# Patient Record
Sex: Female | Born: 1980
Health system: Southern US, Community
[De-identification: ages and names within clinical notes are randomized; demographics above are authoritative.]

## PROBLEM LIST (undated history)

## (undated) DIAGNOSIS — F329 Major depressive disorder, single episode, unspecified: Secondary | ICD-10-CM

## (undated) DIAGNOSIS — E559 Vitamin D deficiency, unspecified: Secondary | ICD-10-CM

## (undated) DIAGNOSIS — F32A Depression, unspecified: Secondary | ICD-10-CM

## (undated) DIAGNOSIS — J45909 Unspecified asthma, uncomplicated: Secondary | ICD-10-CM

## (undated) DIAGNOSIS — T8859XA Other complications of anesthesia, initial encounter: Secondary | ICD-10-CM

## (undated) DIAGNOSIS — K219 Gastro-esophageal reflux disease without esophagitis: Secondary | ICD-10-CM

## (undated) DIAGNOSIS — T4145XA Adverse effect of unspecified anesthetic, initial encounter: Secondary | ICD-10-CM

## (undated) DIAGNOSIS — A6000 Herpesviral infection of urogenital system, unspecified: Secondary | ICD-10-CM

## (undated) DIAGNOSIS — J3089 Other allergic rhinitis: Secondary | ICD-10-CM

## (undated) DIAGNOSIS — F419 Anxiety disorder, unspecified: Secondary | ICD-10-CM

## (undated) DIAGNOSIS — R87619 Unspecified abnormal cytological findings in specimens from cervix uteri: Secondary | ICD-10-CM

## (undated) HISTORY — DX: Depression, unspecified: F32.A

## (undated) HISTORY — PX: BREAST REDUCTION SURGERY: SHX8

## (undated) HISTORY — PX: TONSILLECTOMY: SUR1361

## (undated) HISTORY — PX: OTHER SURGICAL HISTORY: SHX169

## (undated) HISTORY — DX: Major depressive disorder, single episode, unspecified: F32.9

## (undated) HISTORY — DX: Anxiety disorder, unspecified: F41.9

## (undated) HISTORY — DX: Herpesviral infection of urogenital system, unspecified: A60.00

## (undated) HISTORY — DX: Unspecified abnormal cytological findings in specimens from cervix uteri: R87.619

## (undated) HISTORY — DX: Vitamin D deficiency, unspecified: E55.9

## (undated) HISTORY — PX: MOUTH SURGERY: SHX715

## (undated) HISTORY — PX: APPENDECTOMY: SHX54

---
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
## 2009-07-25 ENCOUNTER — Ambulatory Visit: Payer: Self-pay | Admitting: Otolaryngology

## 2013-12-25 ENCOUNTER — Observation Stay: Payer: Self-pay

## 2014-01-06 ENCOUNTER — Inpatient Hospital Stay: Payer: Self-pay

## 2014-01-06 LAB — PIH PROFILE
Anion Gap: 7 (ref 7–16)
BUN: 8 mg/dL (ref 7–18)
CHLORIDE: 109 mmol/L — AB (ref 98–107)
CO2: 17 mmol/L — AB (ref 21–32)
Calcium, Total: 9.3 mg/dL (ref 8.5–10.1)
Creatinine: 0.59 mg/dL — ABNORMAL LOW (ref 0.60–1.30)
EGFR (Non-African Amer.): >60
Glucose: 77 mg/dL (ref 65–99)
HCT: 36.2 % (ref 35.0–47.0)
HGB: 11.7 g/dL — ABNORMAL LOW (ref 12.0–16.0)
MCH: 28 pg (ref 26.0–34.0)
MCHC: 32.4 g/dL (ref 32.0–36.0)
MCV: 86 fL (ref 80–100)
Osmolality: 264 (ref 275–301)
Platelet: 220 10*3/uL (ref 150–440)
Potassium: 3.7 mmol/L (ref 3.5–5.1)
RBC: 4.19 10*6/uL (ref 3.80–5.20)
RDW: 13.6 % (ref 11.5–14.5)
SGOT(AST): 17 U/L (ref 15–37)
Sodium: 133 mmol/L — ABNORMAL LOW (ref 136–145)
URIC ACID: 5.3 mg/dL (ref 2.6–6.0)
WBC: 12 10*3/uL — ABNORMAL HIGH (ref 3.6–11.0)

## 2014-01-06 LAB — PROTEIN / CREATININE RATIO, URINE
Creatinine, Urine: 53 mg/dL (ref 30.0–125.0)
PROTEIN/CREAT. RATIO: 547 mg/g{creat} — AB (ref 0–200)
Protein, Random Urine: 29 mg/dL — ABNORMAL HIGH (ref 0–12)

## 2014-01-08 LAB — CBC WITH DIFFERENTIAL/PLATELET
Basophil #: 0.2 10*3/uL — ABNORMAL HIGH (ref 0.0–0.1)
Basophil %: 2.5 %
EOS ABS: 0.3 10*3/uL (ref 0.0–0.7)
EOS PCT: 2.6 %
HCT: 35.6 % (ref 35.0–47.0)
HGB: 11.7 g/dL — AB (ref 12.0–16.0)
LYMPHS PCT: 14.6 %
Lymphocyte #: 1.4 10*3/uL (ref 1.0–3.6)
MCH: 28.5 pg (ref 26.0–34.0)
MCHC: 32.9 g/dL (ref 32.0–36.0)
MCV: 87 fL (ref 80–100)
MONOS PCT: 6.1 %
Monocyte #: 0.6 x10 3/mm (ref 0.2–0.9)
NEUTROS PCT: 74.2 %
Neutrophil #: 7.2 10*3/uL — ABNORMAL HIGH (ref 1.4–6.5)
Platelet: 199 10*3/uL (ref 150–440)
RBC: 4.12 10*6/uL (ref 3.80–5.20)
RDW: 14.1 % (ref 11.5–14.5)
WBC: 9.7 10*3/uL (ref 3.6–11.0)

## 2014-01-09 LAB — HEMATOCRIT: HCT: 30.6 % — ABNORMAL LOW (ref 35.0–47.0)

## 2014-06-14 LAB — HM PAP SMEAR: HM Pap smear: NEGATIVE

## 2014-08-29 NOTE — H&P (Signed)
L&D Evaluation:  History:  HPI 10yoMWF sent over from office for [place] work-up and IOL due to non-reassuring NST and elevated BPs   Patient's Medical History other  HSV   Patient's Surgical History other  T&A, breast reduction   Medications Pre Natal Vitamins  Tylenol (Acetaminophen)  TUMS, acyclovir   Allergies NKDA   Social History none   Family History Non-Contributory   ROS:  ROS All systems were reviewed.  HEENT, CNS, GI, GU, Respiratory, CV, Renal and Musculoskeletal systems were found to be normal.   Exam:  Vital Signs BP >140/90   Urine Protein trace   General no apparent distress   Mental Status clear   Chest clear   Heart normal sinus rhythm   Abdomen gravid, non-tender   Estimated Fetal Weight Average for gestational age   Fetal Position vtx   Back no CVAT   Edema 1+   Reflexes 1+   Clonus negative   Pelvic no external lesions, 1/th/-2   Mebranes Intact   FHT normal rate with no decels   Fetal Heart Rate 115   Ucx irregular   Length of each Contraction 50 seconds   Ucx Pain Scale 1   Impression:  Impression evaluation for PIH, IOL   Plan:  Plan EFM/NST, monitor BP, PIH panel, cervidil ripening and Pitocin IOL   Electronic Signatures: Trudee Kuster, Jennefer Kopp N (CNM)  (Signed 18-Sep-15 18:10)  Authored: L&D Evaluation   Last Updated: 18-Sep-15 18:10 by Evonnie Pat (CNM)

## 2015-01-24 ENCOUNTER — Other Ambulatory Visit (INDEPENDENT_AMBULATORY_CARE_PROVIDER_SITE_OTHER): Payer: BLUE CROSS/BLUE SHIELD | Admitting: Obstetrics and Gynecology

## 2015-01-24 DIAGNOSIS — R300 Dysuria: Secondary | ICD-10-CM

## 2015-01-24 LAB — POCT URINALYSIS DIPSTICK
Bilirubin, UA: NEGATIVE
Glucose, UA: NEGATIVE
Ketones, UA: NEGATIVE
LEUKOCYTES UA: NEGATIVE
Nitrite, UA: NEGATIVE
PROTEIN UA: NEGATIVE
SPEC GRAV UA: 1.01
Urobilinogen, UA: 0.2
pH, UA: 7

## 2015-01-24 NOTE — Progress Notes (Unsigned)
Pt dropped off UA, c/o RLQ pain, some pressure when urinating

## 2015-01-25 LAB — URINE CULTURE: Organism ID, Bacteria: NO GROWTH

## 2015-01-26 ENCOUNTER — Encounter: Payer: Self-pay | Admitting: Obstetrics and Gynecology

## 2015-01-26 ENCOUNTER — Ambulatory Visit: Payer: BLUE CROSS/BLUE SHIELD

## 2015-01-26 ENCOUNTER — Ambulatory Visit (INDEPENDENT_AMBULATORY_CARE_PROVIDER_SITE_OTHER): Payer: BLUE CROSS/BLUE SHIELD | Admitting: Obstetrics and Gynecology

## 2015-01-26 VITALS — BP 110/81 | HR 82 | Ht 68.0 in | Wt 169.6 lb

## 2015-01-26 DIAGNOSIS — R102 Pelvic and perineal pain: Secondary | ICD-10-CM | POA: Diagnosis not present

## 2015-01-26 NOTE — Progress Notes (Signed)
Subjective:     Patient ID: Angela [name], female   DOB: 06-28-1980, 34 y.o.   MRN: [id number]  HPI Pelvic pain- sudden onset 3 nights ago- woke her up and persisted on/off through the night, and has reoccured intermittent since, not relieved with ibuprofen, worse when ambulation, better if she lays supine; is two weeks from LMP that was normal. UA and Urine culture this week was negative. Can't palpate IUD string, and had bright red spotting x 2 days.Denies any urinary or bowel changes.  Review of Systems See above    Objective:   Physical Exam A&O x4 Well groomed female in mild pain Abdomen soft and slightly tender in RLQ Pelvic exam: CERVIX: multiparous os, IUD string noted and scant red discharge noted, UTERUS: tenderness with mov't and shifted over to the left, retroverted, mobile, not enlarged, ADNEXA: tenderness right, mass present right side, size 2-4 cm, pushing uterus to the left  Previous UA and urine culture negative  Pelvis ultrasound reveals: Findings:  The uterus measures 6.8 x 4.4 x 5.4 cm and is retroverted. Echo texture is homogenous without evidence of focal masses. There is an IUD seen within the uterus and it appears to be in the correct position.   The Endometrium measures 2.5 mm.  Right Ovary measures 4.0 x 2.2 x 2.0 cm. There are multiple follicles seen as well as good vascularity.  Left Ovary measures 31.8.8 x 2.4 x  Cm. Multiple follicles seen with good vascular flow to the ovary.  Survey of the adnexa demonstrates no adnexal masses. There is no free fluid in the cul de sac.  Impression: 1. Normal appearing pelvic ultrasound with retroverted uterus with IUD seen in proper position.  .    Assessment:     Pelvic pain IUD check BTB with mirena     Plan:     Reviewed findings with patients. Recommend watching and pain worsens to go in to ED for evaluation of appendix, otherwise to let me know next week how she is feeling.      Melody Trudee Kuster, CNM

## 2015-01-30 ENCOUNTER — Telehealth: Payer: Self-pay | Admitting: Obstetrics and Gynecology

## 2015-01-30 NOTE — Telephone Encounter (Signed)
PT CALLED IN WAS TOLD TO CALL IN TODAY TO GIVE YOU AN UPDATE, SHE IS BETTER BUT NOT 100 %, SHE STATED TAHT HER PCP MIGHT HAVE LYMPHADENITIS, SHE IS STIL HAVE A 5-6 OUT OF 10 PAIN.

## 2015-06-18 ENCOUNTER — Encounter: Payer: Self-pay | Admitting: *Deleted

## 2015-06-21 ENCOUNTER — Encounter: Payer: Self-pay | Admitting: Obstetrics and Gynecology

## 2015-08-14 ENCOUNTER — Encounter: Payer: Self-pay | Admitting: Obstetrics and Gynecology

## 2015-08-14 ENCOUNTER — Ambulatory Visit (INDEPENDENT_AMBULATORY_CARE_PROVIDER_SITE_OTHER): Payer: BLUE CROSS/BLUE SHIELD | Admitting: Obstetrics and Gynecology

## 2015-08-14 VITALS — BP 103/74 | HR 94 | Wt 170.1 lb

## 2015-08-14 DIAGNOSIS — Z01419 Encounter for gynecological examination (general) (routine) without abnormal findings: Secondary | ICD-10-CM | POA: Diagnosis not present

## 2015-08-14 DIAGNOSIS — J302 Other seasonal allergic rhinitis: Secondary | ICD-10-CM | POA: Diagnosis not present

## 2015-08-14 MED ORDER — ALBUTEROL SULFATE (2.5 MG/3ML) 0.083% IN NEBU: 2.5000 mg | INHALATION_SOLUTION | Freq: Four times a day (QID) | RESPIRATORY_TRACT | Status: DC | PRN

## 2015-08-14 MED ORDER — IBUPROFEN 800 MG PO TABS: 800.0000 mg | ORAL_TABLET | Freq: Three times a day (TID) | ORAL | Status: DC | PRN

## 2015-08-14 MED ORDER — ALBUTEROL SULFATE HFA 108 (90 BASE) MCG/ACT IN AERS: 2.0000 | INHALATION_SPRAY | Freq: Four times a day (QID) | RESPIRATORY_TRACT | Status: DC | PRN

## 2015-08-14 NOTE — Patient Instructions (Signed)
  Place annual gynecologic exam patient instructions here.  Thank you for enrolling in [facility]. Please follow the instructions below to securely access your online medical record. MyChart allows you to send messages to your doctor, view your test results, manage appointments, and more.   How Do I Sign Up? 1. In your Internet browser, go to [employer] and enter [email or web address]. 2. Click on the Sign Up Now link in the Sign In box. You will see the New Member Sign Up page. 3. Enter your MyChart Access Code exactly as it appears below. You will not need to use this code after you've completed the sign-up process. If you do not sign up before the expiration date, you must request a new code.  MyChart Access Code: [email or web address] Expires: 10/13/2015  2:29 PM  4. Enter your Social Security Number ([id number]) and Date of Birth (mm/dd/yyyy) as indicated and click Submit. You will be taken to the next sign-up page. 5. Create a MyChart ID. This will be your MyChart login ID and cannot be changed, so think of one that is secure and easy to remember. 6. Create a MyChart password. You can change your password at any time. 7. Enter your Password Reset Question and Answer. This can be used at a later time if you forget your password.  8. Enter your e-mail address. You will receive e-mail notification when new information is available in [place]. 9. Click Sign Up. You can now view your medical record.   Additional Information Remember, MyChart is NOT to be used for urgent needs. For medical emergencies, dial 911.

## 2015-08-14 NOTE — Progress Notes (Signed)
Subjective:   Angela [name] is a 35 y.o. G73P0 Caucasian female here for a routine well-woman exam.  Patient's last menstrual period was 08/01/2015.    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  Past Medical History Past Medical History  Diagnosis Date  . Herpes genitalis   . Abnormal Pap smear of cervix   . Vitamin D deficiency   . Anxiety   . Depression     Past Surgical History Past Surgical History  Procedure Laterality Date  . Appendectomy    . Breast reduction surgery      Gynecologic History G1P0  Patient's last menstrual period was 08/01/2015. Contraception: IUD Last Pap: 2016. Results were: normal Last mammogram: 2010. Results were: normal  Obstetric History OB History  Gravida Para Term Preterm AB SAB TAB Ectopic Multiple Living  1         1    # Outcome Date GA Lbr Len/2nd Weight Sex Delivery Anes PTL Lv  1 Gravida 01/08/14    M Vag-Spont   Y      Current Medications Current Outpatient Prescriptions on File Prior to Visit  Medication Sig Dispense Refill  . levonorgestrel (MIRENA) 20 MCG/24HR IUD 1 each by Intrauterine route once.     No current facility-administered medications on file prior to visit.    Review of Systems Patient denies any headaches, blurred vision, shortness of breath, chest pain, abdominal pain, problems with bowel movements, urination, or intercourse.  Objective:  BP 103/74 mmHg  Pulse 94  Wt 170 lb 1.6 oz (77.157 kg)  LMP 08/01/2015  Breastfeeding? No Physical Exam   General:  Well developed, well nourished, no acute distress. She is alert and oriented x3. Skin:  Warm and dry Neck:  Midline trachea, no thyromegaly or nodules Cardiovascular: Regular rate and rhythm, no murmur heard Lungs:  Effort normal, all lung fields clear to auscultation bilaterally Breasts:  No dominant palpable mass, retraction, or nipple discharge Abdomen:  Soft, non tender, no hepatosplenomegaly or masses Pelvic:  External genitalia is normal in appearance.  The vagina is normal in appearance. The cervix is bulbous, no CMT.  IUD string palpated in cervical os. Thin prep pap is not done . Uterus is felt to be normal size, shape, and contour.  No adnexal masses or tenderness noted. Extremities:  No swelling or varicosities noted Psych:  She has a normal mood and affect  Assessment:   Healthy well-woman exam Seasonal asthma Left knee pain-from previous injury  Plan:  meds refilled Discussed potential future pregnancy F/U 1 year for AE, or sooner if needed   Melody Rockney Ghee, CNM

## 2016-02-05 ENCOUNTER — Telehealth: Payer: Self-pay | Admitting: Obstetrics and Gynecology

## 2016-02-05 ENCOUNTER — Other Ambulatory Visit: Payer: Self-pay | Admitting: *Deleted

## 2016-02-05 DIAGNOSIS — N926 Irregular menstruation, unspecified: Secondary | ICD-10-CM

## 2016-02-05 NOTE — Telephone Encounter (Signed)
Periods lasting up to 2wks/ bleeding after sex/ increased dizziness with PMS / call b/t 130 -[phone].Marland Kitchen And after 330 pm

## 2016-02-05 NOTE — Telephone Encounter (Signed)
Please ask her if the bleeding is heavy or spotting? And if heavy may need to come in for placement u/s (she has a Mirena), also any more symptoms with dizziness? How long does it last?

## 2016-02-05 NOTE — Telephone Encounter (Signed)
Called pt she is coming in 02/07/16 for [place]

## 2016-02-05 NOTE — Telephone Encounter (Signed)
pls advise

## 2016-02-07 ENCOUNTER — Ambulatory Visit (INDEPENDENT_AMBULATORY_CARE_PROVIDER_SITE_OTHER): Payer: BLUE CROSS/BLUE SHIELD

## 2016-02-07 DIAGNOSIS — N926 Irregular menstruation, unspecified: Secondary | ICD-10-CM | POA: Diagnosis not present

## 2016-03-06 ENCOUNTER — Encounter: Payer: Self-pay | Admitting: Obstetrics and Gynecology

## 2016-03-06 ENCOUNTER — Ambulatory Visit (INDEPENDENT_AMBULATORY_CARE_PROVIDER_SITE_OTHER): Payer: BLUE CROSS/BLUE SHIELD | Admitting: Obstetrics and Gynecology

## 2016-03-06 VITALS — BP 111/84 | HR 96 | Ht 68.0 in | Wt 166.8 lb

## 2016-03-06 DIAGNOSIS — Z30432 Encounter for removal of intrauterine contraceptive device: Secondary | ICD-10-CM | POA: Diagnosis not present

## 2016-03-06 NOTE — Progress Notes (Signed)
Angela [name] is a 35 y.o. year old G9P0 Caucasian female who presents for removal of a Mirena IUD. Her Mirena IUD was placed 2 years ago, but continues to have prolonged menses. Desires switching to Nuvaring..   Patient's last menstrual period was 02/24/2016. BP 111/84   Pulse 96   Ht 5\' 8"  (1.727 m)   Wt 166 lb 12.8 oz (75.7 kg)   LMP 02/24/2016   BMI 25.36 kg/m   Time out was performed.  A graves speculum was placed in the vagina.  The cervix was visualized, and the strings were visible. They were grasped and the Mirena was easily removed intact without complications.   F/U as needed.  Melody Rockney Ghee, CNM

## 2016-04-25 ENCOUNTER — Telehealth: Payer: Self-pay | Admitting: Obstetrics and Gynecology

## 2016-04-25 NOTE — Telephone Encounter (Signed)
PT CALLED AND NEEDS TO SEE IF WE CAN RE SEND THE RX FOR HER NUVA RING IN TO [facility]. SHE HAS BEEN TRYING TO GET SOMEONE ON THE PHONE TO ASK IF THEY RECEIVED IT BUT SHE CANT GET ANYONE OF THE PHONE. SO SHE WANTED TO SEE IF WE CAN RE SEND IT, SHE NEEDS THE NEW ONE BY 1/18

## 2016-04-28 ENCOUNTER — Other Ambulatory Visit: Payer: Self-pay | Admitting: *Deleted

## 2016-04-28 MED ORDER — ETONOGESTREL-ETHINYL ESTRADIOL 0.12-0.015 MG/24HR VA RING: VAGINAL_RING | VAGINAL | 6 refills | Status: DC

## 2016-04-28 NOTE — Telephone Encounter (Signed)
Done-ac 

## 2016-08-14 ENCOUNTER — Ambulatory Visit (INDEPENDENT_AMBULATORY_CARE_PROVIDER_SITE_OTHER): Payer: BLUE CROSS/BLUE SHIELD | Admitting: Obstetrics and Gynecology

## 2016-08-14 ENCOUNTER — Encounter: Payer: Self-pay | Admitting: Obstetrics and Gynecology

## 2016-08-14 VITALS — BP 109/76 | HR 78 | Ht 68.0 in | Wt 166.9 lb

## 2016-08-14 DIAGNOSIS — Z975 Presence of (intrauterine) contraceptive device: Secondary | ICD-10-CM | POA: Diagnosis not present

## 2016-08-14 DIAGNOSIS — Z01419 Encounter for gynecological examination (general) (routine) without abnormal findings: Secondary | ICD-10-CM | POA: Diagnosis not present

## 2016-08-14 DIAGNOSIS — N921 Excessive and frequent menstruation with irregular cycle: Secondary | ICD-10-CM

## 2016-08-14 MED ORDER — ETONOGESTREL-ETHINYL ESTRADIOL 0.12-0.015 MG/24HR VA RING: VAGINAL_RING | VAGINAL | 4 refills | Status: DC

## 2016-08-14 NOTE — Progress Notes (Signed)
Subjective:   Angela [name] is a 36 y.o. G44P0 Caucasian female here for a routine well-woman exam.  No LMP recorded. Patient is not currently having periods (Reason: Other).    Current complaints: BTB with nuvaring, starting a week before it should PCP: Clemmie Krill       doesn't desire labs  Social History: Sexual: heterosexual Marital Status: married Living situation: with family Occupation: Art therapist Tobacco/alcohol: no tobacco use Illicit drugs: no history of illicit drug use  The following portions of the patient's history were reviewed and updated as appropriate: allergies, current medications, past family history, past medical history, past social history, past surgical history and problem list.  Past Medical History Past Medical History:  Diagnosis Date  . Abnormal Pap smear of cervix   . Anxiety   . Depression   . Herpes genitalis   . Vitamin D deficiency     Past Surgical History Past Surgical History:  Procedure Laterality Date  . APPENDECTOMY    . BREAST REDUCTION SURGERY      Gynecologic History G1P0  No LMP recorded. Patient is not currently having periods (Reason: Other). Contraception: NuvaRing vaginal inserts Last Pap: 2016. Results were: normal   Obstetric History OB History  Gravida Para Term Preterm AB Living  1         1  SAB TAB Ectopic Multiple Live Births          1    # Outcome Date GA Lbr Len/2nd Weight Sex Delivery Anes PTL Lv  1 Gravida 01/08/14    M Vag-Spont   LIV      Current Medications Current Outpatient Prescriptions on File Prior to Visit  Medication Sig Dispense Refill  . albuterol (PROVENTIL HFA;VENTOLIN HFA) 108 (90 Base) MCG/ACT inhaler Inhale 2 puffs into the lungs every 6 (six) hours as needed for wheezing or shortness of breath. 1 Inhaler 2  . albuterol (PROVENTIL) (2.5 MG/3ML) 0.083% nebulizer solution Take 3 mLs (2.5 mg total) by nebulization every 6 (six) hours as needed for wheezing or shortness of breath. 75 mL  12  . azelastine (ASTELIN) 0.1 % nasal spray Place into both nostrils 2 (two) times daily. Use in each nostril as directed    . cetirizine (ZYRTEC) 10 MG tablet Take 10 mg by mouth daily.    Marland Kitchen etonogestrel-ethinyl estradiol (NUVARING) 0.12-0.015 MG/24HR vaginal ring Insert vaginally and leave in place for 3 consecutive weeks, then remove for 1 week. 3 each 6  . fluticasone (FLONASE) 50 MCG/ACT nasal spray Place into both nostrils daily.    Marland Kitchen ibuprofen (ADVIL,MOTRIN) 800 MG tablet Take 1 tablet (800 mg total) by mouth every 8 (eight) hours as needed. 50 tablet 1   No current facility-administered medications on file prior to visit.     Review of Systems Patient denies any headaches, blurred vision, shortness of breath, chest pain, abdominal pain, problems with bowel movements, urination, or intercourse.  Objective:  BP 109/76   Pulse 78   Ht 5\' 8"  (1.727 m)   Wt 166 lb 14.4 oz (75.7 kg)   BMI 25.38 kg/m  Physical Exam  General:  Well developed, well nourished, no acute distress. She is alert and oriented x3. Skin:  Warm and dry Neck:  Midline trachea, no thyromegaly or nodules Cardiovascular: Regular rate and rhythm, no murmur heard Lungs:  Effort normal, all lung fields clear to auscultation bilaterally Breasts:  No dominant palpable mass, retraction, or nipple discharge, scars from reduction noted without defect(not new) Abdomen:  Soft, non tender, no hepatosplenomegaly or masses Pelvic:  External genitalia is normal in appearance.  The vagina is normal in appearance. The cervix is bulbous, no CMT.  Thin prep pap is not done. Uterus is felt to be normal size, shape, and contour.  No adnexal masses or tenderness noted. Extremities:  No swelling or varicosities noted Psych:  She has a normal mood and affect  Assessment:   Healthy well-woman exam BTB on Nuvaring  Plan:  desirs to continue with Nuvaring, gave taytulla sample to take on bleeding days prn F/U 1 year for AE, or  sooner if needed   Savahanna Almendariz Rockney Ghee, CNM

## 2016-11-23 ENCOUNTER — Ambulatory Visit
Admission: EM | Admit: 2016-11-23 | Discharge: 2016-11-23 | Disposition: A | Discharge status: Home / Self Care | Payer: 59 | Attending: Family Medicine | Admitting: Family Medicine

## 2016-11-23 ENCOUNTER — Encounter: Payer: Self-pay | Admitting: Gynecology

## 2016-11-23 DIAGNOSIS — R05 Cough: Secondary | ICD-10-CM | POA: Diagnosis not present

## 2016-11-23 DIAGNOSIS — R059 Cough, unspecified: Secondary | ICD-10-CM

## 2016-11-23 DIAGNOSIS — J069 Acute upper respiratory infection, unspecified: Secondary | ICD-10-CM

## 2016-11-23 MED ORDER — DEXAMETHASONE SODIUM PHOSPHATE 10 MG/ML IJ SOLN
10.0000 mg | Freq: Once | INTRAMUSCULAR | Status: AC
  Administered 2016-11-23: 10 mg via INTRAMUSCULAR

## 2016-11-23 MED ORDER — BENZONATATE 100 MG PO CAPS: 100.0000 mg | ORAL_CAPSULE | Freq: Three times a day (TID) | ORAL | 0 refills | Status: DC | PRN

## 2016-11-23 MED ORDER — ALBUTEROL SULFATE HFA 108 (90 BASE) MCG/ACT IN AERS: 2.0000 | INHALATION_SPRAY | RESPIRATORY_TRACT | 0 refills | Status: AC | PRN

## 2016-11-23 NOTE — Discharge Instructions (Signed)
Take medication as prescribed. Rest. Drink plenty of fluids.  ° °Follow up with your primary care physician this week as needed. Return to Urgent care for new or worsening concerns.  ° °

## 2016-11-23 NOTE — ED Triage Notes (Signed)
Patient c/o URI x couple days.

## 2016-11-23 NOTE — ED Provider Notes (Signed)
[facility] ____________________________________________  Time seen: Approximately 9:33 AM  I have reviewed the triage vital signs and the nursing notes.   HISTORY  Chief Complaint URI   HPI Angela [name] is a 36 y.o. female  presenting for evaluation of cough and chest congestion with accompanying wheezing. Patient reports she has had a cough and chest congestion that has been present for approximately 4 days. Patient reports minimal nasal congestion. Denies sore throat. Patient reports that she has a history of seasonal allergies, environmental allergies and asthma. Patient expressed concern that current symptoms being triggered by possible mold due to amount of rain that has been having recently. Reports the last 2 days that she has had increased wheezing consistent with her asthma, but reports wheezing has worsened. Reports she has been using home albuterol nebulizer with some improvement. Used albuterol inhaler just prior to exam. Denies home sick Contacts. Reports Recently around [facility]. Denies fevers, sore throat, sinus pressure, weakness, chest pain. Reports some shortness of breath directly associated with wheezing and cough, denies any other shortness of breath, and states no shortness of breath except for with wheezing and cough. reports did take some over-the-counter Mucinex which helped some, no resolution. Denies taking other over-the-counter medications. Reports has continued to remain active. Reports continues to eat and drink well. Denies other complaints. Patient states typically at this point she requires steroids to assist in her asthma. Patient states that her primary care treat her with injectable steroids as oral steroids interferes with her acid reflux. Denies chest pain, hemoptysis, chest pain with deep breath, recent immobilization, abdominal pain, dysuria, extremity pain, extremity swelling or rash. Denies recent sickness. Denies recent antibiotic  use.   Lynnell Jude, MD: PCP Patient's last menstrual period was 11/09/2016.Denies pregnancy.    Past Medical History:  Diagnosis Date  . Abnormal Pap smear of cervix   . Anxiety   . Depression   . Herpes genitalis   . Vitamin D deficiency     There are no active problems to display for this patient.   Past Surgical History:  Procedure Laterality Date  . APPENDECTOMY    . BREAST REDUCTION SURGERY       No current facility-administered medications for this encounter.   Current Outpatient Prescriptions:  .  albuterol (PROVENTIL HFA;VENTOLIN HFA) 108 (90 Base) MCG/ACT inhaler, Inhale 2 puffs into the lungs every 6 (six) hours as needed for wheezing or shortness of breath., Disp: 1 Inhaler, Rfl: 2 .  albuterol (PROVENTIL) (2.5 MG/3ML) 0.083% nebulizer solution, Take 3 mLs (2.5 mg total) by nebulization every 6 (six) hours as needed for wheezing or shortness of breath., Disp: 75 mL, Rfl: 12 .  azelastine (ASTELIN) 0.1 % nasal spray, Place into both nostrils 2 (two) times daily. Use in each nostril as directed, Disp: , Rfl:  .  cetirizine (ZYRTEC) 10 MG tablet, Take 10 mg by mouth daily., Disp: , Rfl:  .  etonogestrel-ethinyl estradiol (NUVARING) 0.12-0.015 MG/24HR vaginal ring, Insert vaginally and leave in place for 3 consecutive weeks, then remove for 1 week., Disp: 3 each, Rfl: 4 .  fluticasone (FLONASE) 50 MCG/ACT nasal spray, Place into both nostrils daily., Disp: , Rfl:  .  ibuprofen (ADVIL,MOTRIN) 800 MG tablet, Take 1 tablet (800 mg total) by mouth every 8 (eight) hours as needed., Disp: 50 tablet, Rfl: 1 .  albuterol (PROVENTIL HFA;VENTOLIN HFA) 108 (90 Base) MCG/ACT inhaler, Inhale 2 puffs into the lungs every 4 (four) hours as needed for wheezing  or shortness of breath., Disp: 1 Inhaler, Rfl: 0 .  benzonatate (TESSALON PERLES) 100 MG capsule, Take 1 capsule (100 mg total) by mouth 3 (three) times daily as needed for cough., Disp: 15 capsule, Rfl: 0  Allergies Patient  has no known allergies.  No family history on file.  Social History Social History  Substance Use Topics  . Smoking status: Never Smoker  . Smokeless tobacco: Never Used  . Alcohol use Yes     Comment: occas    Review of Systems Constitutional: No fever/chills Eyes: No visual changes. ENT: No sore throat. Cardiovascular: Denies chest pain. Respiratory: AS above.  Gastrointestinal: No abdominal pain.  No nausea, no vomiting.  No diarrhea.  No constipation. Genitourinary: Negative for dysuria. Musculoskeletal: Negative for back pain. Skin: Negative for rash.  ____________________________________________   PHYSICAL EXAM:  VITAL SIGNS: ED Triage Vitals  Enc Vitals Group     BP 11/23/16 0907 115/77     Pulse Rate 11/23/16 0907 90     Resp 11/23/16 0907 16     Temp 11/23/16 0907 98.6 F (37 C)     Temp Source 11/23/16 0907 Oral     SpO2 11/23/16 0907 100 %     Weight 11/23/16 0906 170 lb (77.1 kg)     Height 11/23/16 0906 5\' 8"  (1.727 m)     Head Circumference --      Peak Flow --      Pain Score 11/23/16 0907 0     Pain Loc --      Pain Edu? --      Excl. in [place]? --     Constitutional: Alert and oriented. Well appearing and in no acute distress. Head: Atraumatic. No sinus tenderness to palpation. No swelling. No erythema.  Ears: no erythema, normal TMs bilaterally.   Nose:No nasal congestion  Mouth/Throat: Mucous membranes are moist. No pharyngeal erythema. No tonsillar swelling or exudate.  Neck: No stridor.  No cervical spine tenderness to palpation. Hematological/Lymphatic/Immunilogical: No cervical lymphadenopathy. Cardiovascular: Normal rate, regular rhythm. Grossly normal heart sounds.  Good peripheral circulation. Respiratory: Normal respiratory effort.  No retractions. No wheezes, rales or rhonchi. Good air movement.  Dry intermittent cough in room with bronchospasm. Speaking in complete sentences. Gastrointestinal: Soft and nontender.  Musculoskeletal:  Ambulatory with steady gait. No cervical, thoracic or lumbar tenderness to palpation. Neurologic:  Normal speech and language. No gait instability. Skin:  Skin appears warm, dry and intact. No rash noted. Psychiatric: Mood and affect are normal. Speech and behavior are normal.  ___________________________________________   LABS (all labs ordered are listed, but only abnormal results are displayed)  Labs Reviewed - No data to display   PROCEDURES Procedures   INITIAL IMPRESSION / ASSESSMENT AND PLAN / ED COURSE  Pertinent labs & imaging results that were available during my care of the patient were reviewed by me and considered in my medical decision making (see chart for details).  Very well-appearing patient. No acute distress. Suspect environmental allergy versus viral upper respiratory infection with asthma exacerbation. Discussed in detail with patient, no clear indication for an antibiotic use at this time. Encouraged supportive care. Patient states she does not tolerate oral prednisone well. 10 mg IM Decadron given once in urgent care. Continue use of home albuterol as needed, prn tessalon perles. Discussed indication, risks and benefits of medications with patient.  Discussed follow up with Primary care physician this week. Discussed follow up and return parameters including no resolution or any worsening concerns.  Patient verbalized understanding and agreed to plan.   ____________________________________________   FINAL CLINICAL IMPRESSION(S) / ED DIAGNOSES  Final diagnoses:  Cough  Upper respiratory tract infection, unspecified type     Discharge Medication List as of 11/23/2016  9:47 AM    START taking these medications   Details  benzonatate (TESSALON PERLES) 100 MG capsule Take 1 capsule (100 mg total) by mouth 3 (three) times daily as needed for cough., Starting Sun 11/23/2016, Normal        Note: This dictation was prepared with Dragon dictation along with  smaller phrase technology. Any transcriptional errors that result from this process are unintentional.         Marylene Land, NP 11/23/16 1730

## 2016-12-18 ENCOUNTER — Encounter: Payer: Self-pay | Admitting: Obstetrics and Gynecology

## 2016-12-18 ENCOUNTER — Ambulatory Visit (INDEPENDENT_AMBULATORY_CARE_PROVIDER_SITE_OTHER): Payer: 59 | Admitting: Obstetrics and Gynecology

## 2016-12-18 VITALS — BP 118/81 | HR 86 | Ht 68.0 in | Wt 176.0 lb

## 2016-12-18 DIAGNOSIS — O09529 Supervision of elderly multigravida, unspecified trimester: Secondary | ICD-10-CM

## 2016-12-18 DIAGNOSIS — R12 Heartburn: Secondary | ICD-10-CM | POA: Diagnosis not present

## 2016-12-18 DIAGNOSIS — N926 Irregular menstruation, unspecified: Secondary | ICD-10-CM

## 2016-12-18 LAB — POCT URINE PREGNANCY: PREG TEST UR: POSITIVE — AB

## 2016-12-18 MED ORDER — RANITIDINE HCL 75 MG PO TABS: 75.0000 mg | ORAL_TABLET | Freq: Two times a day (BID) | ORAL | 10 refills | Status: DC

## 2016-12-18 NOTE — Patient Instructions (Signed)
First Trimester of Pregnancy The first trimester of pregnancy is from week 1 until the end of week 13 (months 1 through 3). A week after a sperm fertilizes an egg, the egg will implant on the wall of the uterus. This embryo will begin to develop into a baby. Genes from you and your partner will form the baby. The female genes will determine whether the baby will be a boy or a girl. At 6-8 weeks, the eyes and face will be formed, and the heartbeat can be seen on ultrasound. At the end of 12 weeks, all the baby's organs will be formed. Now that you are pregnant, you will want to do everything you can to have a healthy baby. Two of the most important things are to get good prenatal care and to follow your health care provider's instructions. Prenatal care is all the medical care you receive before the baby's birth. This care will help prevent, find, and treat any problems during the pregnancy and childbirth. Body changes during your first trimester Your body goes through many changes during pregnancy. The changes vary from woman to woman.  You may gain or lose a couple of pounds at first.  You may feel sick to your stomach (nauseous) and you may throw up (vomit). If the vomiting is uncontrollable, call your health care provider.  You may tire easily.  You may develop headaches that can be relieved by medicines. All medicines should be approved by your health care provider.  You may urinate more often. Painful urination may mean you have a bladder infection.  You may develop heartburn as a result of your pregnancy.  You may develop constipation because certain hormones are causing the muscles that push stool through your intestines to slow down.  You may develop hemorrhoids or swollen veins (varicose veins).  Your breasts may begin to grow larger and become tender. Your nipples may stick out more, and the tissue that surrounds them (areola) may become darker.  Your gums may bleed and may be  sensitive to brushing and flossing.  Dark spots or blotches (chloasma, mask of pregnancy) may develop on your face. This will likely fade after the baby is born.  Your menstrual periods will stop.  You may have a loss of appetite.  You may develop cravings for certain kinds of food.  You may have changes in your emotions from day to day, such as being excited to be pregnant or being concerned that something may go wrong with the pregnancy and baby.  You may have more vivid and strange dreams.  You may have changes in your hair. These can include thickening of your hair, rapid growth, and changes in texture. Some women also have hair loss during or after pregnancy, or hair that feels dry or thin. Your hair will most likely return to normal after your baby is born.  What to expect at prenatal visits During a routine prenatal visit:  You will be weighed to make sure you and the baby are growing normally.  Your blood pressure will be taken.  Your abdomen will be measured to track your baby's growth.  The fetal heartbeat will be listened to between weeks 10 and 14 of your pregnancy.  Test results from any previous visits will be discussed.  Your health care provider may ask you:  How you are feeling.  If you are feeling the baby move.  If you have had any abnormal symptoms, such as leaking fluid, bleeding, severe headaches,   or abdominal cramping.  If you are using any tobacco products, including cigarettes, chewing tobacco, and electronic cigarettes.  If you have any questions.  Other tests that may be performed during your first trimester include:  Blood tests to find your blood type and to check for the presence of any previous infections. The tests will also be used to check for low iron levels (anemia) and protein on red blood cells (Rh antibodies). Depending on your risk factors, or if you previously had diabetes during pregnancy, you may have tests to check for high blood  sugar that affects pregnant women (gestational diabetes).  Urine tests to check for infections, diabetes, or protein in the urine.  An ultrasound to confirm the proper growth and development of the baby.  Fetal screens for spinal cord problems (spina bifida) and Down syndrome.  HIV (human immunodeficiency virus) testing. Routine prenatal testing includes screening for HIV, unless you choose not to have this test.  You may need other tests to make sure you and the baby are doing well.  Follow these instructions at home: Medicines  Follow your health care provider's instructions regarding medicine use. Specific medicines may be either safe or unsafe to take during pregnancy.  Take a prenatal vitamin that contains at least 600 micrograms (mcg) of folic acid.  If you develop constipation, try taking a stool softener if your health care provider approves. Eating and drinking  Eat a balanced diet that includes fresh fruits and vegetables, whole grains, good sources of protein such as meat, eggs, or tofu, and low-fat dairy. Your health care provider will help you determine the amount of weight gain that is right for you.  Avoid raw meat and uncooked cheese. These carry germs that can cause birth defects in the baby.  Eating four or five small meals rather than three large meals a day may help relieve nausea and vomiting. If you start to feel nauseous, eating a few soda crackers can be helpful. Drinking liquids between meals, instead of during meals, also seems to help ease nausea and vomiting.  Limit foods that are high in fat and processed sugars, such as fried and sweet foods.  To prevent constipation: ? Eat foods that are high in fiber, such as fresh fruits and vegetables, whole grains, and beans. ? Drink enough fluid to keep your urine clear or pale yellow. Activity  Exercise only as directed by your health care provider. Most women can continue their usual exercise routine during  pregnancy. Try to exercise for 30 minutes at least 5 days a week. Exercising will help you: ? Control your weight. ? Stay in shape. ? Be prepared for labor and delivery.  Experiencing pain or cramping in the lower abdomen or lower back is a good sign that you should stop exercising. Check with your health care provider before continuing with normal exercises.  Try to avoid standing for long periods of time. Move your legs often if you must stand in one place for a long time.  Avoid heavy lifting.  Wear low-heeled shoes and practice good posture.  You may continue to have sex unless your health care provider tells you not to. Relieving pain and discomfort  Wear a good support bra to relieve breast tenderness.  Take warm sitz baths to soothe any pain or discomfort caused by hemorrhoids. Use hemorrhoid cream if your health care provider approves.  Rest with your legs elevated if you have leg cramps or low back pain.  If you develop   varicose veins in your legs, wear support hose. Elevate your feet for 15 minutes, 3-4 times a day. Limit salt in your diet. Prenatal care  Schedule your prenatal visits by the twelfth week of pregnancy. They are usually scheduled monthly at first, then more often in the last 2 months before delivery.  Write down your questions. Take them to your prenatal visits.  Keep all your prenatal visits as told by your health care provider. This is important. Safety  Wear your seat belt at all times when driving.  Make a list of emergency phone numbers, including numbers for family, friends, the hospital, and police and fire departments. General instructions  Ask your health care provider for a referral to a local prenatal education class. Begin classes no later than the beginning of month 6 of your pregnancy.  Ask for help if you have counseling or nutritional needs during pregnancy. Your health care provider can offer advice or refer you to specialists for help  with various needs.  Do not use hot tubs, steam rooms, or saunas.  Do not douche or use tampons or scented sanitary pads.  Do not cross your legs for long periods of time.  Avoid cat litter boxes and soil used by cats. These carry germs that can cause birth defects in the baby and possibly loss of the fetus by miscarriage or stillbirth.  Avoid all smoking, herbs, alcohol, and medicines not prescribed by your health care provider. Chemicals in these products affect the formation and growth of the baby.  Do not use any products that contain nicotine or tobacco, such as cigarettes and e-cigarettes. If you need help quitting, ask your health care provider. You may receive counseling support and other resources to help you quit.  Schedule a dentist appointment. At home, brush your teeth with a soft toothbrush and be gentle when you floss. Contact a health care provider if:  You have dizziness.  You have mild pelvic cramps, pelvic pressure, or nagging pain in the abdominal area.  You have persistent nausea, vomiting, or diarrhea.  You have a bad smelling vaginal discharge.  You have pain when you urinate.  You notice increased swelling in your face, hands, legs, or ankles.  You are exposed to fifth disease or chickenpox.  You are exposed to German measles (rubella) and have never had it. Get help right away if:  You have a fever.  You are leaking fluid from your vagina.  You have spotting or bleeding from your vagina.  You have severe abdominal cramping or pain.  You have rapid weight gain or loss.  You vomit blood or material that looks like coffee grounds.  You develop a severe headache.  You have shortness of breath.  You have any kind of trauma, such as from a fall or a car accident. Summary  The first trimester of pregnancy is from week 1 until the end of week 13 (months 1 through 3).  Your body goes through many changes during pregnancy. The changes vary from  woman to woman.  You will have routine prenatal visits. During those visits, your health care provider will examine you, discuss any test results you may have, and talk with you about how you are feeling. This information is not intended to replace advice given to you by your health care provider. Make sure you discuss any questions you have with your health care provider. Document Released: 04/01/2001 Document Revised: 03/19/2016 Document Reviewed: 03/19/2016 Elsevier Interactive Patient Education  2017 Elsevier   Inc.  

## 2016-12-18 NOTE — Progress Notes (Signed)
Subjective:     Patient ID: Angela [name], female   DOB: 28-Feb-1981, 36 y.o.   MRN: [id number]  HPI Here for pregnancy confirmation. Reports LMP 11/05/16, EGA [redacted]w[redacted]d & EDC 08/12/17. Reports fatigue and breast tenderness and increased reflux. Normal symptoms for her with first pregnancy.  Was using Nuvaring.   Review of Systems Negative except stated above    Objective:   Physical Exam  A&Ox4 Well groomed female in no distress Blood pressure 118/81, pulse 86, height 5\' 8"  (1.727 m), weight 176 lb (79.8 kg), last menstrual period 11/05/2016. UPT+      Assessment:     Missed menses    Plan:     Will return in 2 weeks for viability scan and nurse intake, then 5 weeks for NOB pe with me.  Melody Humeston, CNM

## 2016-12-26 ENCOUNTER — Other Ambulatory Visit: Payer: 59

## 2016-12-26 DIAGNOSIS — N926 Irregular menstruation, unspecified: Secondary | ICD-10-CM

## 2016-12-27 LAB — BETA HCG QUANT (REF LAB): HCG QUANT: 72914 m[IU]/mL

## 2016-12-31 ENCOUNTER — Ambulatory Visit (INDEPENDENT_AMBULATORY_CARE_PROVIDER_SITE_OTHER): Payer: 59

## 2016-12-31 DIAGNOSIS — N926 Irregular menstruation, unspecified: Secondary | ICD-10-CM

## 2017-01-02 ENCOUNTER — Other Ambulatory Visit: Payer: 59

## 2017-01-08 ENCOUNTER — Ambulatory Visit: Payer: 59 | Admitting: Obstetrics and Gynecology

## 2017-01-08 VITALS — BP 119/80 | HR 70 | Ht 68.0 in | Wt 175.5 lb

## 2017-01-08 DIAGNOSIS — Z3481 Encounter for supervision of other normal pregnancy, first trimester: Secondary | ICD-10-CM

## 2017-01-08 NOTE — Progress Notes (Signed)
Angela [name] presents for NOB nurse interview visit. Pregnancy confirmation done ___8/30/18 EWC___.  G-2 .  P- 1. Pregnancy education material explained and given. __0_ cats in the home. NOB labs ordered. (TSH/HbgA1c due to Increased BMI), (sickle cell). HIV labs and Drug screen were explained optional and she did not decline. Drug screen ordered. PNV encouraged. Genetic screening options discussed. Genetic testing: Unsure.  Pt may discuss with provider. Pt. To follow up with provider in _4_ weeks for NOB physical.  All questions answered.

## 2017-01-09 LAB — CBC WITH DIFFERENTIAL
BASOS ABS: 0 10*3/uL (ref 0.0–0.2)
BASOS: 0 %
EOS (ABSOLUTE): 0.2 10*3/uL (ref 0.0–0.4)
Eos: 3 %
Hematocrit: 39.4 % (ref 34.0–46.6)
Hemoglobin: 13.3 g/dL (ref 11.1–15.9)
IMMATURE GRANS (ABS): 0 10*3/uL (ref 0.0–0.1)
Immature Granulocytes: 0 %
LYMPHS: 29 %
Lymphocytes Absolute: 1.8 10*3/uL (ref 0.7–3.1)
MCH: 27.7 pg (ref 26.6–33.0)
MCHC: 33.8 g/dL (ref 31.5–35.7)
MCV: 82 fL (ref 79–97)
Monocytes Absolute: 0.5 10*3/uL (ref 0.1–0.9)
Monocytes: 8 %
NEUTROS PCT: 60 %
Neutrophils Absolute: 3.8 10*3/uL (ref 1.4–7.0)
RBC: 4.8 x10E6/uL (ref 3.77–5.28)
RDW: 14.2 % (ref 12.3–15.4)
WBC: 6.3 10*3/uL (ref 3.4–10.8)

## 2017-01-09 LAB — ABO AND RH: Rh Factor: POSITIVE

## 2017-01-09 LAB — HIV ANTIBODY (ROUTINE TESTING W REFLEX): HIV SCREEN 4TH GENERATION: NONREACTIVE

## 2017-01-09 LAB — RUBELLA SCREEN: RUBELLA: 4.75 {index} (ref >0.99)

## 2017-01-09 LAB — ANTIBODY SCREEN: ANTIBODY SCREEN: NEGATIVE

## 2017-01-09 LAB — HEPATITIS B SURFACE ANTIGEN: Hepatitis B Surface Ag: NEGATIVE

## 2017-01-09 LAB — RPR: RPR: NONREACTIVE

## 2017-01-09 LAB — VARICELLA ZOSTER ANTIBODY, IGG: Varicella zoster IgG: 2416 index (ref >165)

## 2017-01-10 LAB — MONITOR DRUG PROFILE 14(MW)
AMPHETAMINE SCREEN URINE: NEGATIVE ng/mL
BARBITURATE SCREEN URINE: NEGATIVE ng/mL
BENZODIAZEPINE SCREEN, URINE: NEGATIVE ng/mL
Buprenorphine, Urine: NEGATIVE ng/mL
CANNABINOIDS UR QL SCN: NEGATIVE ng/mL
Cocaine (Metab) Scrn, Ur: NEGATIVE ng/mL
Creatinine(Crt), U: 26.9 mg/dL (ref 20.0–300.0)
Fentanyl, Urine: NEGATIVE pg/mL
MEPERIDINE SCREEN, URINE: NEGATIVE ng/mL
METHADONE SCREEN, URINE: NEGATIVE ng/mL
OPIATE SCREEN URINE: NEGATIVE ng/mL
OXYCODONE+OXYMORPHONE UR QL SCN: NEGATIVE ng/mL
PH UR, DRUG SCRN: 6.2 (ref 4.5–8.9)
PROPOXYPHENE SCREEN URINE: NEGATIVE ng/mL
Phencyclidine Qn, Ur: NEGATIVE ng/mL
SPECIFIC GRAVITY: 1.007
TRAMADOL SCREEN, URINE: NEGATIVE ng/mL

## 2017-01-10 LAB — URINE CULTURE: Organism ID, Bacteria: NO GROWTH

## 2017-01-10 LAB — NICOTINE SCREEN, URINE: COTININE UR QL SCN: NEGATIVE ng/mL

## 2017-01-10 LAB — MICROSCOPIC EXAMINATION: Casts: NONE SEEN /lpf

## 2017-01-10 LAB — URINALYSIS, ROUTINE W REFLEX MICROSCOPIC
BILIRUBIN UA: NEGATIVE
Glucose, UA: NEGATIVE
LEUKOCYTES UA: NEGATIVE
Nitrite, UA: NEGATIVE
PROTEIN UA: NEGATIVE
SPEC GRAV UA: 1.007 (ref 1.005–1.030)
Urobilinogen, Ur: 0.2 mg/dL (ref 0.2–1.0)
pH, UA: 6.5 (ref 5.0–7.5)

## 2017-01-21 ENCOUNTER — Encounter: Payer: Self-pay | Admitting: Obstetrics and Gynecology

## 2017-01-21 ENCOUNTER — Ambulatory Visit (INDEPENDENT_AMBULATORY_CARE_PROVIDER_SITE_OTHER): Payer: 59 | Admitting: Obstetrics and Gynecology

## 2017-01-21 VITALS — BP 117/81 | HR 87 | Wt 179.7 lb

## 2017-01-21 DIAGNOSIS — Z3A11 11 weeks gestation of pregnancy: Secondary | ICD-10-CM

## 2017-01-21 LAB — POCT URINALYSIS DIPSTICK
Bilirubin, UA: NEGATIVE
Blood, UA: NEGATIVE
GLUCOSE UA: NEGATIVE
Ketones, UA: NEGATIVE
LEUKOCYTES UA: NEGATIVE
NITRITE UA: NEGATIVE
Protein, UA: NEGATIVE
SPEC GRAV UA: 1.015 (ref 1.010–1.025)
UROBILINOGEN UA: 0.2 U/dL
pH, UA: 6.5 (ref 5.0–8.0)

## 2017-01-21 NOTE — Progress Notes (Signed)
NOB physical- pt is doing well, denies any complaints, has a rash under her arm pits

## 2017-01-21 NOTE — Progress Notes (Signed)
NEW OB HISTORY AND PHYSICAL  SUBJECTIVE:       Angela [name] is a 36 y.o. G2P0 female, Patient's last menstrual period was 11/09/2016 (approximate)., Estimated Date of Delivery: 08/12/17, [redacted]w[redacted]d, presents today for establishment of Prenatal Care. She has no unusual complaints.      Gynecologic History Patient's last menstrual period was 11/09/2016 (approximate). Abnormal Contraception: NuvaRing vaginal inserts Last Pap: 2016. Results were: normal  Obstetric History OB History  Gravida Para Term Preterm AB Living  2         1  SAB TAB Ectopic Multiple Live Births          1    # Outcome Date GA Lbr Len/2nd Weight Sex Delivery Anes PTL Lv  2 Current           1 Gravida 01/08/14    M Vag-Spont   LIV      Past Medical History:  Diagnosis Date  . Abnormal Pap smear of cervix   . Anxiety   . Depression   . Herpes genitalis   . Vitamin D deficiency     Past Surgical History:  Procedure Laterality Date  . APPENDECTOMY    . BREAST REDUCTION SURGERY      Current Outpatient Prescriptions on File Prior to Visit  Medication Sig Dispense Refill  . albuterol (PROVENTIL HFA;VENTOLIN HFA) 108 (90 Base) MCG/ACT inhaler Inhale 2 puffs into the lungs every 4 (four) hours as needed for wheezing or shortness of breath. 1 Inhaler 0  . azelastine (ASTELIN) 0.1 % nasal spray Place into both nostrils 2 (two) times daily. Use in each nostril as directed    . fluticasone (FLONASE) 50 MCG/ACT nasal spray Place into both nostrils daily.    . Prenatal Vit-Fe Fumarate-FA (PRENATAL MULTIVITAMIN) TABS tablet Take 1 tablet by mouth daily at 12 noon.    . cetirizine (ZYRTEC) 10 MG tablet Take 10 mg by mouth daily.    . ranitidine (ZANTAC 75) 75 MG tablet Take 1 tablet (75 mg total) by mouth 2 (two) times daily. (Patient not taking: Reported on 01/21/2017) 60 tablet 10   No current facility-administered medications on file prior to visit.     No Known Allergies  Social History   Social History   . Marital status: Married    Spouse name: N/A  . Number of children: N/A  . Years of education: N/A   Occupational History  . Not on file.   Social History Main Topics  . Smoking status: Never Smoker  . Smokeless tobacco: Never Used  . Alcohol use No     Comment: occas  . Drug use: No  . Sexual activity: Yes   Other Topics Concern  . Not on file   Social History Narrative  . No narrative on file    No family history on file.  The following portions of the patient's history were reviewed and updated as appropriate: allergies, current medications, past OB history, past medical history, past surgical history, past family history, past social history, and problem list.    OBJECTIVE: Initial Physical Exam (New OB)  GENERAL APPEARANCE: alert, well appearing, in no apparent distress, oriented to person, place and time HEAD: normocephalic, atraumatic MOUTH: mucous membranes moist, pharynx normal without lesions and dental hygiene good THYROID: no thyromegaly or masses present BREASTS: not examined LUNGS: clear to auscultation, no wheezes, rales or rhonchi, symmetric air entry HEART: regular rate and rhythm, no murmurs ABDOMEN: soft, nontender, nondistended, no abnormal masses, no epigastric  pain, fundus not palpable and FHT present EXTREMITIES: no redness or tenderness in the calves or thighs SKIN: normal coloration and turgor, no rashes LYMPH NODES: no adenopathy palpable NEUROLOGIC: alert, oriented, normal speech, no focal findings or movement disorder noted  PELVIC EXAM not indicated  ASSESSMENT: Normal pregnancy AMA  PLAN: Prenatal care Genetic screening done today See orders

## 2017-01-21 NOTE — Patient Instructions (Signed)

## 2017-02-17 ENCOUNTER — Ambulatory Visit (INDEPENDENT_AMBULATORY_CARE_PROVIDER_SITE_OTHER): Payer: 59 | Admitting: Certified Nurse Midwife

## 2017-02-17 VITALS — BP 120/83 | HR 80 | Wt 180.0 lb

## 2017-02-17 DIAGNOSIS — O09522 Supervision of elderly multigravida, second trimester: Secondary | ICD-10-CM

## 2017-02-17 DIAGNOSIS — Z23 Encounter for immunization: Secondary | ICD-10-CM | POA: Diagnosis not present

## 2017-02-17 DIAGNOSIS — Z3492 Encounter for supervision of normal pregnancy, unspecified, second trimester: Secondary | ICD-10-CM

## 2017-02-17 DIAGNOSIS — Z8759 Personal history of other complications of pregnancy, childbirth and the puerperium: Secondary | ICD-10-CM | POA: Insufficient documentation

## 2017-02-17 LAB — POCT URINALYSIS DIPSTICK
BILIRUBIN UA: NEGATIVE
Blood, UA: NEGATIVE
GLUCOSE UA: NEGATIVE
KETONES UA: NEGATIVE
LEUKOCYTES UA: NEGATIVE
Nitrite, UA: NEGATIVE
Protein, UA: NEGATIVE
SPEC GRAV UA: 1.01 (ref 1.010–1.025)
Urobilinogen, UA: 0.2 E.U./dL
pH, UA: 7.5 (ref 5.0–8.0)

## 2017-02-17 MED ORDER — ASPIRIN EC 81 MG PO TBEC: 81.0000 mg | DELAYED_RELEASE_TABLET | Freq: Every day | ORAL | 2 refills | Status: DC

## 2017-02-17 NOTE — Progress Notes (Signed)
ROB- pt is doing well, flu vaccine given 

## 2017-02-17 NOTE — Patient Instructions (Signed)

## 2017-02-17 NOTE — Progress Notes (Signed)
ROB-Pt doing well. History significant for elevated blood pressure requiring induction of labor with last pregnancy. Discussed research and recommendations regarding pre-eclampsia prevention with the use of daily 81 mg Aspirin during pregnancy. Rx: Aspirin, see orders. Flu shot given today. Reviewed red flag symptoms and when to call. RTC x 4-5 weeks for anatomy scan and ROB or sooner if needed.

## 2017-03-27 ENCOUNTER — Ambulatory Visit (INDEPENDENT_AMBULATORY_CARE_PROVIDER_SITE_OTHER): Payer: 59

## 2017-03-27 ENCOUNTER — Ambulatory Visit (INDEPENDENT_AMBULATORY_CARE_PROVIDER_SITE_OTHER): Payer: 59 | Admitting: Obstetrics and Gynecology

## 2017-03-27 VITALS — BP 113/77 | HR 91 | Wt 185.6 lb

## 2017-03-27 DIAGNOSIS — O4442 Low lying placenta NOS or without hemorrhage, second trimester: Secondary | ICD-10-CM

## 2017-03-27 DIAGNOSIS — Z3492 Encounter for supervision of normal pregnancy, unspecified, second trimester: Secondary | ICD-10-CM

## 2017-03-27 DIAGNOSIS — O09522 Supervision of elderly multigravida, second trimester: Secondary | ICD-10-CM

## 2017-03-27 LAB — POCT URINALYSIS DIPSTICK
Bilirubin, UA: NEGATIVE
Glucose, UA: NEGATIVE
Ketones, UA: NEGATIVE
Leukocytes, UA: NEGATIVE
Nitrite, UA: NEGATIVE
PH UA: 6 (ref 5.0–8.0)
PROTEIN UA: NEGATIVE
RBC UA: NEGATIVE
SPEC GRAV UA: 1.01 (ref 1.010–1.025)
UROBILINOGEN UA: 0.2 U/dL

## 2017-03-27 NOTE — Progress Notes (Signed)
ROB- anatomy scan done today, pt is having some headaches, and some wheezing- albuterol isnt helping

## 2017-03-27 NOTE — Progress Notes (Signed)
Anatomy scan & ROB:  Scan reveals: Angela [name] intrauterine pregnancy is visualized with FHR at 139 BPM. Biometrics give an (U/S) Gestational age of 12 4/7 weeks and an (U/S) EDD of 08/10/17; this correlates with the clinically established EDD of 08/12/17.  Fetal presentation is variable.  EFW: 382 grams (0lb 13oz). Placenta: Posterior and grade 1.  Placenta is low-lying at 1.8 cm from cervical os. AFI: WNL subjectively.  Anatomic survey is complete and appears WNL. Gender - Female.   Right Ovary measures 2.9 x 2.0 x 1.7 cm and appears WNL.  Left Ovary was not visualized due to overlying bowel gas. There is no obvious evidence of a corpus luteal cyst. Survey of the adnexa demonstrates no adnexal masses. There is no free peritoneal fluid in the cul de sac.  Impression: 1. 20 4/7 week Viable Singleton Intrauterine pregnancy by U/S. 2. (U/S) EDD is consistent with Clinically established (LMP) EDD of 08/12/17. 3. Normal Anatomy Scan 4. Low-lying placenta at 1.8 cm from cervical os.

## 2017-04-17 ENCOUNTER — Encounter: Payer: Self-pay | Admitting: Obstetrics and Gynecology

## 2017-04-21 NOTE — L&D Delivery Note (Signed)
Delivery Note At  00:39am a viable and healthy Female "Angela [name]" was delivered via  (Presentation:ROA ;  ).  APGAR:9 ,9  .   Placenta status: delivered intact with 3 vessel Cord:  with the following complications: [place] x 2 easily reduced on perineum  Anesthesia:  epidural Episiotomy:  none Lacerations:  1st degree Suture Repair: 3.0 vicryl rapide Est. Blood Loss (mL):    Mom to postpartum.  Baby to Couplet care / Skin to Skin.  Angela [name] 08/03/2017, 12:55 AM

## 2017-04-22 DIAGNOSIS — M9902 Segmental and somatic dysfunction of thoracic region: Secondary | ICD-10-CM | POA: Diagnosis not present

## 2017-04-22 DIAGNOSIS — M9901 Segmental and somatic dysfunction of cervical region: Secondary | ICD-10-CM | POA: Diagnosis not present

## 2017-04-22 DIAGNOSIS — M542 Cervicalgia: Secondary | ICD-10-CM | POA: Diagnosis not present

## 2017-04-24 ENCOUNTER — Ambulatory Visit (INDEPENDENT_AMBULATORY_CARE_PROVIDER_SITE_OTHER): Payer: 59 | Admitting: Obstetrics and Gynecology

## 2017-04-24 ENCOUNTER — Telehealth: Payer: Self-pay | Admitting: Obstetrics and Gynecology

## 2017-04-24 VITALS — BP 117/75 | HR 75 | Wt 196.6 lb

## 2017-04-24 DIAGNOSIS — Z3492 Encounter for supervision of normal pregnancy, unspecified, second trimester: Secondary | ICD-10-CM

## 2017-04-24 NOTE — Progress Notes (Signed)
ROB- pt is doing well, had a 10 lb weight gain since last visit

## 2017-04-24 NOTE — Progress Notes (Signed)
ROB- doing well, to watch weight gain. glucola next visit

## 2017-04-24 NOTE — Telephone Encounter (Signed)
The patient checked out upfront and was upset about having to rotate. Melody stated in the patient's disposition that she was to see a different provider, and I also called and spoke with Amy who also stated that the patient needed to rotate to see a different provider. The pateint was argumentative and unprofessional, And I asked the patient if she would like me call Melody or Amy to the front office to resolve her issue, and the patient said "no".

## 2017-04-27 DIAGNOSIS — M9902 Segmental and somatic dysfunction of thoracic region: Secondary | ICD-10-CM | POA: Diagnosis not present

## 2017-04-27 DIAGNOSIS — M9901 Segmental and somatic dysfunction of cervical region: Secondary | ICD-10-CM | POA: Diagnosis not present

## 2017-04-27 DIAGNOSIS — M542 Cervicalgia: Secondary | ICD-10-CM | POA: Diagnosis not present

## 2017-04-28 ENCOUNTER — Other Ambulatory Visit: Payer: Self-pay | Admitting: Certified Nurse Midwife

## 2017-04-28 DIAGNOSIS — O4440 Low lying placenta NOS or without hemorrhage, unspecified trimester: Secondary | ICD-10-CM

## 2017-04-29 ENCOUNTER — Encounter: Payer: Self-pay | Admitting: Obstetrics and Gynecology

## 2017-05-06 DIAGNOSIS — M542 Cervicalgia: Secondary | ICD-10-CM | POA: Diagnosis not present

## 2017-05-06 DIAGNOSIS — M9901 Segmental and somatic dysfunction of cervical region: Secondary | ICD-10-CM | POA: Diagnosis not present

## 2017-05-06 DIAGNOSIS — M9902 Segmental and somatic dysfunction of thoracic region: Secondary | ICD-10-CM | POA: Diagnosis not present

## 2017-05-14 ENCOUNTER — Encounter: Payer: Self-pay | Admitting: Obstetrics and Gynecology

## 2017-05-14 ENCOUNTER — Other Ambulatory Visit: Payer: Self-pay | Admitting: *Deleted

## 2017-05-14 MED ORDER — AZELASTINE HCL 0.1 % NA SOLN: 1.0000 | Freq: Two times a day (BID) | NASAL | 3 refills | Status: DC

## 2017-05-15 DIAGNOSIS — M9901 Segmental and somatic dysfunction of cervical region: Secondary | ICD-10-CM | POA: Diagnosis not present

## 2017-05-15 DIAGNOSIS — M542 Cervicalgia: Secondary | ICD-10-CM | POA: Diagnosis not present

## 2017-05-15 DIAGNOSIS — M9902 Segmental and somatic dysfunction of thoracic region: Secondary | ICD-10-CM | POA: Diagnosis not present

## 2017-05-22 ENCOUNTER — Ambulatory Visit (INDEPENDENT_AMBULATORY_CARE_PROVIDER_SITE_OTHER): Payer: 59 | Admitting: Certified Nurse Midwife

## 2017-05-22 ENCOUNTER — Encounter: Payer: Self-pay | Admitting: Certified Nurse Midwife

## 2017-05-22 ENCOUNTER — Ambulatory Visit: Payer: 59

## 2017-05-22 ENCOUNTER — Ambulatory Visit (INDEPENDENT_AMBULATORY_CARE_PROVIDER_SITE_OTHER): Payer: 59

## 2017-05-22 VITALS — BP 114/83 | HR 91 | Wt 199.7 lb

## 2017-05-22 DIAGNOSIS — Z3483 Encounter for supervision of other normal pregnancy, third trimester: Secondary | ICD-10-CM

## 2017-05-22 DIAGNOSIS — O4440 Low lying placenta NOS or without hemorrhage, unspecified trimester: Secondary | ICD-10-CM | POA: Diagnosis not present

## 2017-05-22 LAB — POCT URINALYSIS DIPSTICK
Bilirubin, UA: NEGATIVE
Blood, UA: NEGATIVE
Glucose, UA: NEGATIVE
KETONES UA: NEGATIVE
NITRITE UA: NEGATIVE
Odor: NEGATIVE
PH UA: 8 (ref 5.0–8.0)
Protein, UA: NEGATIVE
UROBILINOGEN UA: 0.2 U/dL

## 2017-05-22 NOTE — Progress Notes (Signed)
ROB, doing well. U/s today for placenta low lying- it has resolved. See below. TDAp is out- will do next visit. BTC/RPR/CBC/1 hr GTT today. Discussed cord blood donation ( public & private) and birth control after baby. Pamphlet given. Follow up 2 wks.   Philip Aspen, CNM   ULTRASOUND REPORT  Location: [facility] Date of Service:  05/22/2017  Indications: Growth for LLP Findings:  Singleton intrauterine pregnancy is visualized with FHR at 125 BPM. Biometrics give an (U/S) Gestational age of 69 3/7 weeks and an (U/S) EDD of 08/04/17; this correlates with the clinically established EDD of 08/12/17.  Fetal presentation is transverse with fetal head to maternal right.  EFW: 1398 grams (3lb 1oz).  63rd percentile. Placenta: Posterior and grade 1.  Placenta is no longer low-lying at 4.4 cm from cervical os. AFI: 18.7 cm.  Fetal stomach, kidneys, and bladder appears WNL.  Impression: 1. 29 3/7 week Viable Singleton Intrauterine pregnancy by U/S. 2. (U/S) EDD is consistent with Clinically established (LMP) EDD of 08/12/17. 3. EFW: 1398 grams (3lb 1oz).  63rd percentile. 4. Placenta is no longer low-lying at 4.4 cm from cervical os.  Recommendations: 1.Clinical correlation with the patient's History and Physical Exam.   Dario Ave, RDMS

## 2017-05-22 NOTE — Patient Instructions (Signed)

## 2017-05-22 NOTE — Progress Notes (Signed)
ROB- u/s LLP, BTC, gtt- done.  NEEDS TDAP NV.

## 2017-05-22 NOTE — Addendum Note (Signed)
Addended by: Raliegh Ip on: 05/22/2017 11:32 AM   Modules accepted: Orders

## 2017-05-23 LAB — GLUCOSE, 1 HOUR GESTATIONAL: GESTATIONAL DIABETES SCREEN: 141 mg/dL — AB (ref 65–139)

## 2017-05-23 LAB — CBC
HEMATOCRIT: 34.8 % (ref 34.0–46.6)
Hemoglobin: 11.9 g/dL (ref 11.1–15.9)
MCH: 28 pg (ref 26.6–33.0)
MCHC: 34.2 g/dL (ref 31.5–35.7)
MCV: 82 fL (ref 79–97)
Platelets: 279 10*3/uL (ref 150–379)
RBC: 4.25 x10E6/uL (ref 3.77–5.28)
RDW: 13.7 % (ref 12.3–15.4)
WBC: 10.8 10*3/uL (ref 3.4–10.8)

## 2017-05-23 LAB — RPR: RPR Ser Ql: NONREACTIVE

## 2017-05-24 ENCOUNTER — Other Ambulatory Visit: Payer: Self-pay | Admitting: Certified Nurse Midwife

## 2017-05-24 ENCOUNTER — Encounter: Payer: Self-pay | Admitting: Certified Nurse Midwife

## 2017-05-24 DIAGNOSIS — O99810 Abnormal glucose complicating pregnancy: Secondary | ICD-10-CM

## 2017-05-24 NOTE — Progress Notes (Signed)
Abnormal 1 hr GTT. Pt notified via my chart.   Philip Aspen, CNM

## 2017-05-25 DIAGNOSIS — M9901 Segmental and somatic dysfunction of cervical region: Secondary | ICD-10-CM | POA: Diagnosis not present

## 2017-05-25 DIAGNOSIS — M9902 Segmental and somatic dysfunction of thoracic region: Secondary | ICD-10-CM | POA: Diagnosis not present

## 2017-05-25 DIAGNOSIS — M542 Cervicalgia: Secondary | ICD-10-CM | POA: Diagnosis not present

## 2017-05-28 ENCOUNTER — Other Ambulatory Visit: Payer: 59

## 2017-05-28 DIAGNOSIS — O99810 Abnormal glucose complicating pregnancy: Secondary | ICD-10-CM | POA: Diagnosis not present

## 2017-05-29 ENCOUNTER — Encounter: Payer: Self-pay | Admitting: Certified Nurse Midwife

## 2017-05-29 LAB — GESTATIONAL GLUCOSE TOLERANCE
GLUCOSE 3 HOUR GTT: 95 mg/dL (ref 65–139)
GLUCOSE FASTING: 85 mg/dL (ref 65–94)
Glucose, GTT - 1 Hour: 128 mg/dL (ref 65–179)
Glucose, GTT - 2 Hour: 116 mg/dL (ref 65–154)

## 2017-06-03 ENCOUNTER — Ambulatory Visit (INDEPENDENT_AMBULATORY_CARE_PROVIDER_SITE_OTHER): Payer: 59 | Admitting: Certified Nurse Midwife

## 2017-06-03 VITALS — BP 124/75 | HR 99 | Wt 206.0 lb

## 2017-06-03 DIAGNOSIS — Z3483 Encounter for supervision of other normal pregnancy, third trimester: Secondary | ICD-10-CM

## 2017-06-03 LAB — POCT URINALYSIS DIPSTICK
Bilirubin, UA: NEGATIVE
Glucose, UA: NEGATIVE
Leukocytes, UA: NEGATIVE
Nitrite, UA: NEGATIVE
PROTEIN UA: NEGATIVE
RBC UA: NEGATIVE
SPEC GRAV UA: 1.015 (ref 1.010–1.025)
UROBILINOGEN UA: 0.2 U/dL
pH, UA: 5 (ref 5.0–8.0)

## 2017-06-03 NOTE — Progress Notes (Signed)
Pt is here for an [facility] visit. States she hasn't had a BM in 4 days.

## 2017-06-03 NOTE — Progress Notes (Signed)
ROB dong well. Ketones noted in her urine, she denies high protein diet. Encouraged increase in water intake. She verbalizes understanding. She complains of leg cramps, encourage use of magnesium. She has a lump under her right arm pit. It is not red, hard or firm. Encouraged warm compress and avoid irritation to the area. She states she has had this in the past and she avoid Deoderant for a few days and it went a way. Warning signs reviewed for infection. She asked about cessation of 81 mg of Asprin. Will consult with MD regarding recommendation as

## 2017-06-05 ENCOUNTER — Telehealth: Payer: Self-pay | Admitting: Certified Nurse Midwife

## 2017-06-05 NOTE — Telephone Encounter (Signed)
Reach out to patient in regards to her asprin use. She had asked how long she will be on the 81 mg of Asprin. My chart message sent.   Philip Aspen, CNM

## 2017-06-08 DIAGNOSIS — M9902 Segmental and somatic dysfunction of thoracic region: Secondary | ICD-10-CM | POA: Diagnosis not present

## 2017-06-08 DIAGNOSIS — M542 Cervicalgia: Secondary | ICD-10-CM | POA: Diagnosis not present

## 2017-06-08 DIAGNOSIS — M9901 Segmental and somatic dysfunction of cervical region: Secondary | ICD-10-CM | POA: Diagnosis not present

## 2017-06-19 ENCOUNTER — Ambulatory Visit (INDEPENDENT_AMBULATORY_CARE_PROVIDER_SITE_OTHER): Payer: 59 | Admitting: Certified Nurse Midwife

## 2017-06-19 ENCOUNTER — Encounter: Payer: Self-pay | Admitting: Certified Nurse Midwife

## 2017-06-19 VITALS — BP 119/78 | HR 84 | Wt 211.4 lb

## 2017-06-19 DIAGNOSIS — Z3483 Encounter for supervision of other normal pregnancy, third trimester: Secondary | ICD-10-CM

## 2017-06-19 DIAGNOSIS — O2243 Hemorrhoids in pregnancy, third trimester: Secondary | ICD-10-CM

## 2017-06-19 LAB — POCT URINALYSIS DIPSTICK
Bilirubin, UA: NEGATIVE
Blood, UA: NEGATIVE
Glucose, UA: NEGATIVE
Ketones, UA: NEGATIVE
LEUKOCYTES UA: NEGATIVE
NITRITE UA: NEGATIVE
PROTEIN UA: NEGATIVE
Urobilinogen, UA: 0.2 E.U./dL
pH, UA: 7.5 (ref 5.0–8.0)

## 2017-06-19 NOTE — Progress Notes (Signed)
ROB pt is doing well expect for still having hemorrhoid that are bleeding.

## 2017-06-19 NOTE — Patient Instructions (Signed)
Hemorrhoids Hemorrhoids are swollen veins in and around the rectum or anus. There are two types of hemorrhoids:  Internal hemorrhoids. These occur in the veins that are just inside the rectum. They may poke through to the outside and become irritated and painful.  External hemorrhoids. These occur in the veins that are outside of the anus and can be felt as a painful swelling or hard lump near the anus.  Most hemorrhoids do not cause serious problems, and they can be managed with home treatments such as diet and lifestyle changes. If home treatments do not help your symptoms, procedures can be done to shrink or remove the hemorrhoids. What are the causes? This condition is caused by increased pressure in the anal area. This pressure may result from various things, including:  Constipation.  Straining to have a bowel movement.  Diarrhea.  Pregnancy.  Obesity.  Sitting for long periods of time.  Heavy lifting or other activity that causes you to strain.  Anal sex.  What are the signs or symptoms? Symptoms of this condition include:  Pain.  Anal itching or irritation.  Rectal bleeding.  Leakage of stool (feces).  Anal swelling.  One or more lumps around the anus.  How is this diagnosed? This condition can often be diagnosed through a visual exam. Other exams or tests may also be done, such as:  Examination of the rectal area with a gloved hand (digital rectal exam).  Examination of the anal canal using a small tube (anoscope).  A blood test, if you have lost a significant amount of blood.  A test to look inside the colon (sigmoidoscopy or colonoscopy).  How is this treated? This condition can usually be treated at home. However, various procedures may be done if dietary changes, lifestyle changes, and other home treatments do not help your symptoms. These procedures can help make the hemorrhoids smaller or remove them completely. Some of these procedures involve  surgery, and others do not. Common procedures include:  Rubber band ligation. Rubber bands are placed at the base of the hemorrhoids to cut off the blood supply to them.  Sclerotherapy. Medicine is injected into the hemorrhoids to shrink them.  Infrared coagulation. A type of light energy is used to get rid of the hemorrhoids.  Hemorrhoidectomy surgery. The hemorrhoids are surgically removed, and the veins that supply them are tied off.  Stapled hemorrhoidopexy surgery. A circular stapling device is used to remove the hemorrhoids and use staples to cut off the blood supply to them.  Follow these instructions at home: Eating and drinking  Eat foods that have a lot of fiber in them, such as whole grains, beans, nuts, fruits, and vegetables. Ask your health care provider about taking products that have added fiber (fiber supplements).  Drink enough fluid to keep your urine clear or pale yellow. Managing pain and swelling  Take warm sitz baths for 20 minutes, 3-4 times a day to ease pain and discomfort.  If directed, apply ice to the affected area. Using ice packs between sitz baths may be helpful. ? Put ice in a plastic bag. ? Place a towel between your skin and the bag. ? Leave the ice on for 20 minutes, 2-3 times a day. General instructions  Take over-the-counter and prescription medicines only as told by your health care provider.  Use medicated creams or suppositories as told.  Exercise regularly.  Go to the bathroom when you have the urge to have a bowel movement. Do not wait.    Avoid straining to have bowel movements.  Keep the anal area dry and clean. Use wet toilet paper or moist towelettes after a bowel movement.  Do not sit on the toilet for long periods of time. This increases blood pooling and pain. Contact a health care provider if:  You have increasing pain and swelling that are not controlled by treatment or medicine.  You have uncontrolled bleeding.  You  have difficulty having a bowel movement, or you are unable to have a bowel movement.  You have pain or inflammation outside the area of the hemorrhoids. This information is not intended to replace advice given to you by your health care provider. Make sure you discuss any questions you have with your health care provider. Document Released: 04/04/2000 Document Revised: 09/05/2015 Document Reviewed: 12/20/2014 Elsevier Interactive Patient Education  2018 [employer]  

## 2017-06-22 DIAGNOSIS — M542 Cervicalgia: Secondary | ICD-10-CM | POA: Diagnosis not present

## 2017-06-22 DIAGNOSIS — M9901 Segmental and somatic dysfunction of cervical region: Secondary | ICD-10-CM | POA: Diagnosis not present

## 2017-06-22 DIAGNOSIS — M9902 Segmental and somatic dysfunction of thoracic region: Secondary | ICD-10-CM | POA: Diagnosis not present

## 2017-06-23 NOTE — Progress Notes (Signed)
ROB-Doing well, except for bleeding hemorrhoid. Declines exam today. Discussed home treatment measures including hydrocortisone and tucks. Reviewed red flag symptoms and when to call. RTC x 2 weeks for ROB or sooner if needed.

## 2017-06-29 ENCOUNTER — Other Ambulatory Visit: Payer: Self-pay | Admitting: *Deleted

## 2017-06-29 ENCOUNTER — Encounter: Payer: Self-pay | Admitting: Obstetrics and Gynecology

## 2017-06-29 ENCOUNTER — Telehealth: Payer: Self-pay | Admitting: Obstetrics and Gynecology

## 2017-06-29 MED ORDER — PROMETHAZINE HCL 25 MG PO TABS: 25.0000 mg | ORAL_TABLET | Freq: Four times a day (QID) | ORAL | 1 refills | Status: DC | PRN

## 2017-06-29 NOTE — Telephone Encounter (Signed)
Sent pt in some phenergan supp, advised pt to try to sip on some ginger ale

## 2017-06-29 NOTE — Telephone Encounter (Signed)
The patient called and stated that she has been vomiting since last night and also having diarrhea along with that. The patient can not keep water down as well and and would lie to speak with a nurse as soon as she can. Please advise. Please advise.

## 2017-07-02 ENCOUNTER — Encounter: Payer: Self-pay | Admitting: Obstetrics and Gynecology

## 2017-07-03 ENCOUNTER — Encounter: Payer: 59 | Admitting: Obstetrics and Gynecology

## 2017-07-03 ENCOUNTER — Ambulatory Visit (INDEPENDENT_AMBULATORY_CARE_PROVIDER_SITE_OTHER): Payer: 59 | Admitting: Obstetrics and Gynecology

## 2017-07-03 VITALS — BP 119/81 | HR 103 | Wt 213.9 lb

## 2017-07-03 DIAGNOSIS — Z3493 Encounter for supervision of normal pregnancy, unspecified, third trimester: Secondary | ICD-10-CM

## 2017-07-03 DIAGNOSIS — Z23 Encounter for immunization: Secondary | ICD-10-CM | POA: Diagnosis not present

## 2017-07-03 MED ORDER — TETANUS-DIPHTH-ACELL PERTUSSIS 5-2.5-18.5 LF-MCG/0.5 IM SUSP
0.5000 mL | Freq: Once | INTRAMUSCULAR | Status: AC
  Administered 2017-07-03: 0.5 mL via INTRAMUSCULAR

## 2017-07-03 NOTE — Progress Notes (Signed)
ROB-discussed allergy medication safe in pregnancy.had stomach virus first of the week. Feels better now. Tdap given.

## 2017-07-03 NOTE — Progress Notes (Signed)
ROB- pt is doing well, allergies are bothering her, tdap given today

## 2017-07-03 NOTE — Patient Instructions (Signed)
Postpartum Tubal Ligation Postpartum tubal ligation (PPTL) is a procedure to close the fallopian tubes. This is done so that you cannot get pregnant. When the fallopian tubes are closed, the eggs that the ovaries release cannot enter the uterus, and sperm cannot reach the eggs. PPTL is done right after childbirth or 1-2 days after childbirth, before the uterus returns to its normal location. PPTL is sometimes called "getting your tubes tied." You should not have this procedure if you want to get pregnant someday or if you are unsure about having more children. Tell a health care provider about:  Any allergies you have.  All medicines you are taking, including vitamins, herbs, eye drops, creams, and over-the-counter medicines.  Previous problems you or members of your family have had with the use of anesthetics.  Any blood disorders you have.  Previous surgeries you have had.  Any medical conditions you may have.  Any past pregnancies. What are the risks? Generally, this is a safe procedure. However, problems may occur, including:  Infection.  Bleeding.  Injury to surrounding organs.  Side effects from anesthetics.  Failure of the procedure.  This procedure can increase your risk of a kind of pregnancy in which a fertilized egg attaches to the outside of the uterus (ectopic pregnancy). What happens before the procedure?  Ask your health care provider about: ? How much pain you can expect to have. ? What medicines you will be given for pain, especially if you are planning to breastfeed.  Follow instructions from your health care provider about eating and drinking restrictions. What happens during the procedure? If you had a vaginal delivery:  You may be given one or more of the following: ? A medicine that helps you relax (sedative). ? A medicine to numb the area (local anesthetic). ? A medicine to make you fall asleep (general anesthetic). ? A medicine that is injected  into an area of your body to numb everything below the injection site (regional anesthetic).  If you have been given a general anesthetic, a tube will be put down your throat to help you breathe.  An IV tube will be inserted into one of your veins to give you medicines and fluids during the procedure.  Your bladder may be emptied with a small tube (catheter).  An incision will be made just below your belly button.  Your fallopian tubes will be located and brought up through the incision.  Your fallopian tubes will be tied off, burned (cauterized), or blocked with a clip, ring, or clamp. A small portion in the center of each fallopian tube may be removed.  The incision will be closed with stitches (sutures).  A bandage (dressing) will be placed over the incision.  If you had a cesarean delivery:  Tubal ligation will be done through the incision that was used for the cesarean delivery of your baby.  The incision will be closed with sutures.  A dressing will be placed over the incision.  The procedure may vary among health care providers and hospitals. What happens after the procedure?  Your blood pressure, heart rate, breathing rate, and blood oxygen level will be monitored often until the medicines you were given have worn off.  You will be given pain medicine as needed.  Do not drive for 24 hours if you received a sedative. This information is not intended to replace advice given to you by your health care provider. Make sure you discuss any questions you have with your health  care provider. Document Released: 04/07/2005 Document Revised: 09/10/2015 Document Reviewed: 03/18/2015 Elsevier Interactive Patient Education  [employer].

## 2017-07-15 ENCOUNTER — Other Ambulatory Visit: Payer: Self-pay | Admitting: *Deleted

## 2017-07-15 ENCOUNTER — Ambulatory Visit (INDEPENDENT_AMBULATORY_CARE_PROVIDER_SITE_OTHER): Payer: 59 | Admitting: Obstetrics and Gynecology

## 2017-07-15 ENCOUNTER — Encounter: Payer: Self-pay | Admitting: Obstetrics and Gynecology

## 2017-07-15 VITALS — BP 124/81 | HR 95 | Wt 218.2 lb

## 2017-07-15 DIAGNOSIS — Z3685 Encounter for antenatal screening for Streptococcus B: Secondary | ICD-10-CM | POA: Diagnosis not present

## 2017-07-15 DIAGNOSIS — Z113 Encounter for screening for infections with a predominantly sexual mode of transmission: Secondary | ICD-10-CM

## 2017-07-15 DIAGNOSIS — Z3493 Encounter for supervision of normal pregnancy, unspecified, third trimester: Secondary | ICD-10-CM

## 2017-07-15 LAB — POCT URINALYSIS DIPSTICK
Bilirubin, UA: NEGATIVE
GLUCOSE UA: NEGATIVE
Ketones, UA: 10
LEUKOCYTES UA: NEGATIVE
Nitrite, UA: NEGATIVE
PH UA: 7 (ref 5.0–8.0)
RBC UA: NEGATIVE
Spec Grav, UA: 1.01 (ref 1.010–1.025)
UROBILINOGEN UA: 0.2 U/dL

## 2017-07-15 MED ORDER — VALACYCLOVIR HCL 500 MG PO TABS: 500.0000 mg | ORAL_TABLET | Freq: Two times a day (BID) | ORAL | 2 refills | Status: AC

## 2017-07-15 NOTE — Progress Notes (Signed)
ROB- cultures obtained, pt is having some SOB, pelvic pressure

## 2017-07-15 NOTE — Progress Notes (Signed)
ROB & cultures:  Reports increased SOB,  To start acyclovir now. Cultures obtained.

## 2017-07-17 ENCOUNTER — Other Ambulatory Visit: Payer: Self-pay | Admitting: Obstetrics and Gynecology

## 2017-07-17 DIAGNOSIS — O99820 Streptococcus B carrier state complicating pregnancy: Secondary | ICD-10-CM

## 2017-07-17 LAB — GC/CHLAMYDIA PROBE AMP
Chlamydia trachomatis, NAA: NEGATIVE
NEISSERIA GONORRHOEAE BY PCR: NEGATIVE

## 2017-07-17 LAB — STREP GP B NAA: Strep Gp B NAA: POSITIVE — AB

## 2017-07-21 ENCOUNTER — Ambulatory Visit (INDEPENDENT_AMBULATORY_CARE_PROVIDER_SITE_OTHER): Payer: 59 | Admitting: Certified Nurse Midwife

## 2017-07-21 VITALS — BP 117/77 | HR 96 | Wt 222.1 lb

## 2017-07-21 DIAGNOSIS — Z3493 Encounter for supervision of normal pregnancy, unspecified, third trimester: Secondary | ICD-10-CM | POA: Diagnosis not present

## 2017-07-21 LAB — POCT URINALYSIS DIPSTICK
BILIRUBIN UA: NEGATIVE
LEUKOCYTES UA: NEGATIVE
Nitrite, UA: NEGATIVE
PH UA: 6 (ref 5.0–8.0)
Protein, UA: NEGATIVE
RBC UA: NEGATIVE
SPEC GRAV UA: 1.02 (ref 1.010–1.025)
UROBILINOGEN UA: 0.2 U/dL

## 2017-07-21 NOTE — Patient Instructions (Signed)
Vaginal Delivery Vaginal delivery means that you will give birth by pushing your baby out of your birth canal (vagina). A team of health care providers will help you before, during, and after vaginal delivery. Birth experiences are unique for every woman and every pregnancy, and birth experiences vary depending on where you choose to give birth. What should I do to prepare for my baby's birth? Before your baby is born, it is important to talk with your health care provider about:  Your labor and delivery preferences. These may include: ? Medicines that you may be given. ? How you will manage your pain. This might include non-medical pain relief techniques or injectable pain relief such as epidural analgesia. ? How you and your baby will be monitored during labor and delivery. ? Who may be in the labor and delivery room with you. ? Your feelings about surgical delivery of your baby (cesarean delivery, or C-section) if this becomes necessary. ? Your feelings about receiving donated blood through an IV tube (blood transfusion) if this becomes necessary.  Whether you are able: ? To take pictures or videos of the birth. ? To eat during labor and delivery. ? To move around, walk, or change positions during labor and delivery.  What to expect after your baby is born, such as: ? Whether delayed umbilical cord clamping and cutting is offered. ? Who will care for your baby right after birth. ? Medicines or tests that may be recommended for your baby. ? Whether breastfeeding is supported in your hospital or birth center. ? How long you will be in the hospital or birth center.  How any medical conditions you have may affect your baby or your labor and delivery experience.  To prepare for your baby's birth, you should also:  Attend all of your health care visits before delivery (prenatal visits) as recommended by your health care provider. This is important.  Prepare your home for your baby's  arrival. Make sure that you have: ? Diapers. ? Baby clothing. ? Feeding equipment. ? Safe sleeping arrangements for you and your baby.  Install a car seat in your vehicle. Have your car seat checked by a certified car seat installer to make sure that it is installed safely.  Think about who will help you with your new baby at home for at least the first several weeks after delivery.  What can I expect when I arrive at the birth center or hospital? Once you are in labor and have been admitted into the hospital or birth center, your health care provider may:  Review your pregnancy history and any concerns you have.  Insert an IV tube into one of your veins. This is used to give you fluids and medicines.  Check your blood pressure, pulse, temperature, and heart rate (vital signs).  Check whether your bag of water (amniotic sac) has broken (ruptured).  Talk with you about your birth plan and discuss pain control options.  Monitoring Your health care provider may monitor your contractions (uterine monitoring) and your baby's heart rate (fetal monitoring). You may need to be monitored:  Often, but not continuously (intermittently).  All the time or for long periods at a time (continuously). Continuous monitoring may be needed if: ? You are taking certain medicines, such as medicine to relieve pain or make your contractions stronger. ? You have pregnancy or labor complications.  Monitoring may be done by:  Placing a special stethoscope or a handheld monitoring device on your abdomen to   check your baby's heartbeat, and feeling your abdomen for contractions. This method of monitoring does not continuously record your baby's heartbeat or your contractions.  Placing monitors on your abdomen (external monitors) to record your baby's heartbeat and the frequency and length of contractions. You may not have to wear external monitors all the time.  Placing monitors inside of your uterus  (internal monitors) to record your baby's heartbeat and the frequency, length, and strength of your contractions. ? Your health care provider may use internal monitors if he or she needs more information about the strength of your contractions or your baby's heart rate. ? Internal monitors are put in place by passing a thin, flexible wire through your vagina and into your uterus. Depending on the type of monitor, it may remain in your uterus or on your baby's head until birth. ? Your health care provider will discuss the benefits and risks of internal monitoring with you and will ask for your permission before inserting the monitors.  Telemetry. This is a type of continuous monitoring that can be done with external or internal monitors. Instead of having to stay in bed, you are able to move around during telemetry. Ask your health care provider if telemetry is an option for you.  Physical exam Your health care provider may perform a physical exam. This may include:  Checking whether your baby is positioned: ? With the head toward your vagina (head-down). This is most common. ? With the head toward the top of your uterus (head-up or breech). If your baby is in a breech position, your health care provider may try to turn your baby to a head-down position so you can deliver vaginally. If it does not seem that your baby can be born vaginally, your provider may recommend surgery to deliver your baby. In rare cases, you may be able to deliver vaginally if your baby is head-up (breech delivery). ? Lying sideways (transverse). Babies that are lying sideways cannot be delivered vaginally.  Checking your cervix to determine: ? Whether it is thinning out (effacing). ? Whether it is opening up (dilating). ? How low your baby has moved into your birth canal.  What are the three stages of labor and delivery?  Normal labor and delivery is divided into the following three stages: Stage 1  Stage 1 is the  longest stage of labor, and it can last for hours or days. Stage 1 includes: ? Early labor. This is when contractions may be irregular, or regular and mild. Generally, early labor contractions are more than 10 minutes apart. ? Active labor. This is when contractions get longer, more regular, more frequent, and more intense. ? The transition phase. This is when contractions happen very close together, are very intense, and may last longer than during any other part of labor.  Contractions generally feel mild, infrequent, and irregular at first. They get stronger, more frequent (about every 2-3 minutes), and more regular as you progress from early labor through active labor and transition.  Many women progress through stage 1 naturally, but you may need help to continue making progress. If this happens, your health care provider may talk with you about: ? Rupturing your amniotic sac if it has not ruptured yet. ? Giving you medicine to help make your contractions stronger and more frequent.  Stage 1 ends when your cervix is completely dilated to 4 inches (10 cm) and completely effaced. This happens at the end of the transition phase. Stage 2  Once   your cervix is completely effaced and dilated to 4 inches (10 cm), you may start to feel an urge to push. It is common for the body to naturally take a rest before feeling the urge to push, especially if you received an epidural or certain other pain medicines. This rest period may last for up to 1-2 hours, depending on your unique labor experience.  During stage 2, contractions are generally less painful, because pushing helps relieve contraction pain. Instead of contraction pain, you may feel stretching and burning pain, especially when the widest part of your baby's head passes through the vaginal opening (crowning).  Your health care provider will closely monitor your pushing progress and your baby's progress through the vagina during stage 2.  Your  health care provider may massage the area of skin between your vaginal opening and anus (perineum) or apply warm compresses to your perineum. This helps it stretch as the baby's head starts to crown, which can help prevent perineal tearing. ? In some cases, an incision may be made in your perineum (episiotomy) to allow the baby to pass through the vaginal opening. An episiotomy helps to make the opening of the vagina larger to allow more room for the baby to fit through.  It is very important to breathe and focus so your health care provider can control the delivery of your baby's head. Your health care provider may have you decrease the intensity of your pushing, to help prevent perineal tearing.  After delivery of your baby's head, the shoulders and the rest of the body generally deliver very quickly and without difficulty.  Once your baby is delivered, the umbilical cord may be cut right away, or this may be delayed for 1-2 minutes, depending on your baby's health. This may vary among health care providers, hospitals, and birth centers.  If you and your baby are healthy enough, your baby may be placed on your chest or abdomen to help maintain the baby's temperature and to help you bond with each other. Some mothers and babies start breastfeeding at this time. Your health care team will dry your baby and help keep your baby warm during this time.  Your baby may need immediate care if he or she: ? Showed signs of distress during labor. ? Has a medical condition. ? Was born too early (prematurely). ? Had a bowel movement before birth (meconium). ? Shows signs of difficulty transitioning from being inside the uterus to being outside of the uterus. If you are planning to breastfeed, your health care team will help you begin a feeding. Stage 3  The third stage of labor starts immediately after the birth of your baby and ends after you deliver the placenta. The placenta is an organ that develops  during pregnancy to provide oxygen and nutrients to your baby in the womb.  Delivering the placenta may require some pushing, and you may have mild contractions. Breastfeeding can stimulate contractions to help you deliver the placenta.  After the placenta is delivered, your uterus should tighten (contract) and become firm. This helps to stop bleeding in your uterus. To help your uterus contract and to control bleeding, your health care provider may: ? Give you medicine by injection, through an IV tube, by mouth, or through your rectum (rectally). ? Massage your abdomen or perform a vaginal exam to remove any blood clots that are left in your uterus. ? Empty your bladder by placing a thin, flexible tube (catheter) into your bladder. ? Encourage   you to breastfeed your baby. After labor is over, you and your baby will be monitored closely to ensure that you are both healthy until you are ready to go home. Your health care team will teach you how to care for yourself and your baby. This information is not intended to replace advice given to you by your health care provider. Make sure you discuss any questions you have with your health care provider. Document Released: 01/15/2008 Document Revised: 10/26/2015 Document Reviewed: 04/22/2015 Elsevier Interactive Patient Education  2018 [employer] Inc[employer]  

## 2017-07-21 NOTE — Progress Notes (Signed)
Pt is here for an ROB visit. 

## 2017-07-21 NOTE — Progress Notes (Signed)
ROB-Doing well, reports increased pelvic pressure. SVE unchanged from previous visit. Reviewed red flag symptoms and when to call. RTC x 1 week for ROB with Melody or sooner if needed.

## 2017-07-22 DIAGNOSIS — M542 Cervicalgia: Secondary | ICD-10-CM | POA: Diagnosis not present

## 2017-07-22 DIAGNOSIS — M9902 Segmental and somatic dysfunction of thoracic region: Secondary | ICD-10-CM | POA: Diagnosis not present

## 2017-07-22 DIAGNOSIS — M9901 Segmental and somatic dysfunction of cervical region: Secondary | ICD-10-CM | POA: Diagnosis not present

## 2017-07-28 ENCOUNTER — Ambulatory Visit (INDEPENDENT_AMBULATORY_CARE_PROVIDER_SITE_OTHER): Payer: 59 | Admitting: Obstetrics and Gynecology

## 2017-07-28 ENCOUNTER — Other Ambulatory Visit: Payer: Self-pay | Admitting: Obstetrics and Gynecology

## 2017-07-28 VITALS — BP 122/90 | HR 88 | Wt 223.5 lb

## 2017-07-28 DIAGNOSIS — O163 Unspecified maternal hypertension, third trimester: Secondary | ICD-10-CM

## 2017-07-28 DIAGNOSIS — Z3493 Encounter for supervision of normal pregnancy, unspecified, third trimester: Secondary | ICD-10-CM

## 2017-07-28 LAB — POCT URINALYSIS DIPSTICK
Bilirubin, UA: NEGATIVE
Glucose, UA: NEGATIVE
Ketones, UA: NEGATIVE
LEUKOCYTES UA: NEGATIVE
NITRITE UA: NEGATIVE
RBC UA: NEGATIVE
Spec Grav, UA: 1.015 (ref 1.010–1.025)
Urobilinogen, UA: 0.2 E.U./dL
pH, UA: 6.5 (ref 5.0–8.0)

## 2017-07-28 NOTE — Progress Notes (Signed)
ROB- pt is having a lot of pelvic pressure, BP elevated

## 2017-07-28 NOTE — Progress Notes (Signed)
ROB- elevated pressure; discussed PIH and labs obtained, OOW until delivery and modified bedrest. Will start bi-weekly NSTs

## 2017-07-29 ENCOUNTER — Other Ambulatory Visit: Payer: Self-pay | Admitting: Obstetrics and Gynecology

## 2017-07-29 DIAGNOSIS — Z3493 Encounter for supervision of normal pregnancy, unspecified, third trimester: Secondary | ICD-10-CM

## 2017-07-29 LAB — CBC
HEMATOCRIT: 35.1 % (ref 34.0–46.6)
Hemoglobin: 11.7 g/dL (ref 11.1–15.9)
MCH: 26.4 pg — AB (ref 26.6–33.0)
MCHC: 33.3 g/dL (ref 31.5–35.7)
MCV: 79 fL (ref 79–97)
Platelets: 258 10*3/uL (ref 150–379)
RBC: 4.43 x10E6/uL (ref 3.77–5.28)
RDW: 14.3 % (ref 12.3–15.4)
WBC: 10.2 10*3/uL (ref 3.4–10.8)

## 2017-07-29 LAB — PROTEIN / CREATININE RATIO, URINE
Creatinine, Urine: 28.3 mg/dL
PROTEIN/CREAT RATIO: 396 mg/g{creat} — AB (ref 0–200)
Protein, Ur: 11.2 mg/dL

## 2017-07-29 LAB — COMPREHENSIVE METABOLIC PANEL
ALBUMIN: 3.8 g/dL (ref 3.5–5.5)
ALT: 10 IU/L (ref 0–32)
AST: 16 IU/L (ref 0–40)
Albumin/Globulin Ratio: 1.6 (ref 1.2–2.2)
Alkaline Phosphatase: 219 IU/L — ABNORMAL HIGH (ref 39–117)
BUN / CREAT RATIO: 9 (ref 9–23)
BUN: 5 mg/dL — AB (ref 6–20)
Bilirubin Total: 0.2 mg/dL (ref 0.0–1.2)
CO2: 17 mmol/L — ABNORMAL LOW (ref 20–29)
Calcium: 8.4 mg/dL — ABNORMAL LOW (ref 8.7–10.2)
Chloride: 106 mmol/L (ref 96–106)
Creatinine, Ser: 0.57 mg/dL (ref 0.57–1.00)
GFR, EST AFRICAN AMERICAN: 137 mL/min/{1.73_m2} (ref >59)
GFR, EST NON AFRICAN AMERICAN: 119 mL/min/{1.73_m2} (ref >59)
GLUCOSE: 101 mg/dL — AB (ref 65–99)
Globulin, Total: 2.4 g/dL (ref 1.5–4.5)
Potassium: 4.1 mmol/L (ref 3.5–5.2)
Sodium: 140 mmol/L (ref 134–144)
TOTAL PROTEIN: 6.2 g/dL (ref 6.0–8.5)

## 2017-07-31 ENCOUNTER — Encounter: Payer: Self-pay | Admitting: Obstetrics and Gynecology

## 2017-07-31 ENCOUNTER — Ambulatory Visit: Payer: 59

## 2017-07-31 ENCOUNTER — Ambulatory Visit (INDEPENDENT_AMBULATORY_CARE_PROVIDER_SITE_OTHER): Payer: 59 | Admitting: Obstetrics and Gynecology

## 2017-07-31 ENCOUNTER — Ambulatory Visit (INDEPENDENT_AMBULATORY_CARE_PROVIDER_SITE_OTHER): Payer: 59

## 2017-07-31 VITALS — BP 134/84 | HR 106 | Wt 224.0 lb

## 2017-07-31 DIAGNOSIS — Z3493 Encounter for supervision of normal pregnancy, unspecified, third trimester: Secondary | ICD-10-CM | POA: Diagnosis not present

## 2017-07-31 LAB — POCT URINALYSIS DIPSTICK
BILIRUBIN UA: NEGATIVE
Glucose, UA: NEGATIVE
KETONES UA: 15
Nitrite, UA: NEGATIVE
PH UA: 7.5 (ref 5.0–8.0)
RBC UA: NEGATIVE
Spec Grav, UA: 1.01 (ref 1.010–1.025)
UROBILINOGEN UA: 0.2 U/dL

## 2017-07-31 NOTE — Progress Notes (Signed)
Indications:  BPP & Growth for elevated BP Findings:  Singleton intrauterine pregnancy is visualized with FHR at 136 BPM. Biometrics give an (U/S) Gestational age of 41 5/7 weeks and an (U/S) EDD of 07/26/17; this DOES NOT correlate with the clinically established EDD of 08/10/17.  Fetal presentation is vertex.  EFW: 4238 grams (9lb 6oz).   Placenta: Posterior and grade 3. AFI: Upper limits near Polyhydramnios at 24.9 cm.  BPP: 8/8 with good visualization of fetal movement, tone, breathing, and fluid.  Fetal stomach, kidneys, and bladder appear WNL.  Impression: 1. 40 5/7 week Viable Singleton Intrauterine pregnancy by U/S. 2. (U/S) EDD IS NOT consistent with Clinically established (LMP) EDD of 08/10/17 with greater than 21 days difference in size and dates. 3. EFW: 4238 grams (9lb 6oz). 4. BPP: 8/8

## 2017-07-31 NOTE — Progress Notes (Signed)
ROB & NST- growth scan done today, pt is having a lot of pelvic pressure

## 2017-08-02 ENCOUNTER — Inpatient Hospital Stay: Payer: Commercial Managed Care - HMO | Admitting: Anesthesiology

## 2017-08-02 ENCOUNTER — Encounter: Payer: Self-pay | Admitting: *Deleted

## 2017-08-02 ENCOUNTER — Inpatient Hospital Stay
Admission: EM | Admit: 2017-08-02 | Discharge: 2017-08-04 | DRG: 806 | Disposition: A | Discharge status: Home / Self Care | Payer: Commercial Managed Care - HMO | Attending: Obstetrics and Gynecology | Admitting: Obstetrics and Gynecology

## 2017-08-02 ENCOUNTER — Other Ambulatory Visit: Payer: Self-pay

## 2017-08-02 DIAGNOSIS — O139 Gestational [pregnancy-induced] hypertension without significant proteinuria, unspecified trimester: Secondary | ICD-10-CM | POA: Diagnosis present

## 2017-08-02 DIAGNOSIS — O99824 Streptococcus B carrier state complicating childbirth: Secondary | ICD-10-CM | POA: Diagnosis present

## 2017-08-02 DIAGNOSIS — Z3A39 39 weeks gestation of pregnancy: Secondary | ICD-10-CM | POA: Diagnosis not present

## 2017-08-02 DIAGNOSIS — Z3A38 38 weeks gestation of pregnancy: Secondary | ICD-10-CM | POA: Diagnosis not present

## 2017-08-02 DIAGNOSIS — A6000 Herpesviral infection of urogenital system, unspecified: Secondary | ICD-10-CM | POA: Diagnosis present

## 2017-08-02 DIAGNOSIS — R030 Elevated blood-pressure reading, without diagnosis of hypertension: Secondary | ICD-10-CM | POA: Diagnosis not present

## 2017-08-02 DIAGNOSIS — O9832 Other infections with a predominantly sexual mode of transmission complicating childbirth: Secondary | ICD-10-CM | POA: Diagnosis present

## 2017-08-02 DIAGNOSIS — O134 Gestational [pregnancy-induced] hypertension without significant proteinuria, complicating childbirth: Principal | ICD-10-CM | POA: Diagnosis present

## 2017-08-02 HISTORY — DX: Unspecified asthma, uncomplicated: J45.909

## 2017-08-02 HISTORY — DX: Other complications of anesthesia, initial encounter: T88.59XA

## 2017-08-02 HISTORY — DX: Adverse effect of unspecified anesthetic, initial encounter: T41.45XA

## 2017-08-02 LAB — COMPREHENSIVE METABOLIC PANEL
ALBUMIN: 3.1 g/dL — AB (ref 3.5–5.0)
ALT: 8 U/L — ABNORMAL LOW (ref 14–54)
AST: 21 U/L (ref 15–41)
Alkaline Phosphatase: 252 U/L — ABNORMAL HIGH (ref 38–126)
Anion gap: 8 (ref 5–15)
BILIRUBIN TOTAL: 0.5 mg/dL (ref 0.3–1.2)
BUN: 6 mg/dL (ref 6–20)
CO2: 20 mmol/L — AB (ref 22–32)
Calcium: 8.9 mg/dL (ref 8.9–10.3)
Chloride: 104 mmol/L (ref 101–111)
Creatinine, Ser: 0.35 mg/dL — ABNORMAL LOW (ref 0.44–1.00)
GFR calc Af Amer: >60 mL/min (ref >60)
GFR calc non Af Amer: >60 mL/min (ref >60)
GLUCOSE: 78 mg/dL (ref 65–99)
Potassium: 3.8 mmol/L (ref 3.5–5.1)
Sodium: 132 mmol/L — ABNORMAL LOW (ref 135–145)
TOTAL PROTEIN: 6.5 g/dL (ref 6.5–8.1)

## 2017-08-02 LAB — TYPE AND SCREEN
ABO/RH(D): B POS
ANTIBODY SCREEN: NEGATIVE

## 2017-08-02 LAB — CBC
HEMATOCRIT: 35.1 % (ref 35.0–47.0)
HEMOGLOBIN: 12 g/dL (ref 12.0–16.0)
MCH: 27.1 pg (ref 26.0–34.0)
MCHC: 34.3 g/dL (ref 32.0–36.0)
MCV: 79.1 fL — ABNORMAL LOW (ref 80.0–100.0)
Platelets: 285 10*3/uL (ref 150–440)
RBC: 4.44 MIL/uL (ref 3.80–5.20)
RDW: 14.7 % — AB (ref 11.5–14.5)
WBC: 9.3 10*3/uL (ref 3.6–11.0)

## 2017-08-02 LAB — PROTEIN / CREATININE RATIO, URINE
Creatinine, Urine: 86 mg/dL
Protein Creatinine Ratio: 0.12 mg/mg{Cre} (ref 0.00–0.15)
Total Protein, Urine: 10 mg/dL

## 2017-08-02 LAB — URIC ACID: URIC ACID, SERUM: 5.5 mg/dL (ref 2.3–6.6)

## 2017-08-02 MED ORDER — SOD CITRATE-CITRIC ACID 500-334 MG/5ML PO SOLN: 30.0000 mL | ORAL | Status: DC | PRN

## 2017-08-02 MED ORDER — FENTANYL 2.5 MCG/ML W/ROPIVACAINE 0.15% IN NS 100 ML EPIDURAL (ARMC)
EPIDURAL | Status: AC
  Filled 2017-08-02: qty 100

## 2017-08-02 MED ORDER — LACTATED RINGERS IV SOLN: 500.0000 mL | INTRAVENOUS | Status: DC | PRN

## 2017-08-02 MED ORDER — OXYTOCIN 40 UNITS IN LACTATED RINGERS INFUSION - SIMPLE MED
2.5000 [IU]/h | INTRAVENOUS | Status: DC
  Administered 2017-08-03: 2.5 [IU]/h via INTRAVENOUS
  Filled 2017-08-02: qty 1000

## 2017-08-02 MED ORDER — ONDANSETRON HCL 4 MG/2ML IJ SOLN: 4.0000 mg | Freq: Four times a day (QID) | INTRAMUSCULAR | Status: DC | PRN

## 2017-08-02 MED ORDER — LACTATED RINGERS IV SOLN
INTRAVENOUS | Status: DC
  Administered 2017-08-02: 13:00:00 via INTRAVENOUS

## 2017-08-02 MED ORDER — LIDOCAINE HCL (PF) 1 % IJ SOLN: 30.0000 mL | INTRAMUSCULAR | Status: DC | PRN

## 2017-08-02 MED ORDER — EPHEDRINE 5 MG/ML INJ
10.0000 mg | INTRAVENOUS | Status: DC | PRN
  Filled 2017-08-02: qty 2

## 2017-08-02 MED ORDER — TERBUTALINE SULFATE 1 MG/ML IJ SOLN: 0.2500 mg | Freq: Once | INTRAMUSCULAR | Status: DC | PRN

## 2017-08-02 MED ORDER — SODIUM CHLORIDE 0.9 % IV SOLN
2.0000 g | Freq: Once | INTRAVENOUS | Status: AC
  Administered 2017-08-02: 2 g via INTRAVENOUS
  Filled 2017-08-02: qty 2000

## 2017-08-02 MED ORDER — SODIUM CHLORIDE 0.9 % IV SOLN
INTRAVENOUS | Status: DC | PRN
  Administered 2017-08-02 (×3): 5 mL via EPIDURAL

## 2017-08-02 MED ORDER — DIPHENHYDRAMINE HCL 50 MG/ML IJ SOLN: 12.5000 mg | INTRAMUSCULAR | Status: DC | PRN

## 2017-08-02 MED ORDER — OXYTOCIN 40 UNITS IN LACTATED RINGERS INFUSION - SIMPLE MED
1.0000 m[IU]/min | INTRAVENOUS | Status: DC
  Administered 2017-08-02: 1 m[IU]/min via INTRAVENOUS
  Filled 2017-08-02: qty 1000

## 2017-08-02 MED ORDER — LIDOCAINE HCL (PF) 1 % IJ SOLN
INTRAMUSCULAR | Status: AC
  Filled 2017-08-02: qty 30

## 2017-08-02 MED ORDER — LIDOCAINE HCL (PF) 1 % IJ SOLN
INTRAMUSCULAR | Status: DC | PRN
  Administered 2017-08-02: 3 mL

## 2017-08-02 MED ORDER — OXYTOCIN BOLUS FROM INFUSION
500.0000 mL | Freq: Once | INTRAVENOUS | Status: AC
  Administered 2017-08-03: 500 mL via INTRAVENOUS

## 2017-08-02 MED ORDER — AMMONIA AROMATIC IN INHA
RESPIRATORY_TRACT | Status: AC
  Filled 2017-08-02: qty 10

## 2017-08-02 MED ORDER — FENTANYL CITRATE (PF) 100 MCG/2ML IJ SOLN: 50.0000 ug | INTRAMUSCULAR | Status: DC | PRN

## 2017-08-02 MED ORDER — LACTATED RINGERS IV SOLN
500.0000 mL | Freq: Once | INTRAVENOUS | Status: DC
  Administered 2017-08-02: 500 mL via INTRAVENOUS

## 2017-08-02 MED ORDER — MISOPROSTOL 200 MCG PO TABS
ORAL_TABLET | ORAL | Status: AC
  Filled 2017-08-02: qty 4

## 2017-08-02 MED ORDER — ACETAMINOPHEN 325 MG PO TABS: 650.0000 mg | ORAL_TABLET | ORAL | Status: DC | PRN

## 2017-08-02 MED ORDER — SODIUM CHLORIDE 0.9 % IV SOLN
1.0000 g | INTRAVENOUS | Status: DC
  Administered 2017-08-02 (×2): 1 g via INTRAVENOUS
  Filled 2017-08-02 (×4): qty 1000

## 2017-08-02 MED ORDER — PHENYLEPHRINE 40 MCG/ML (10ML) SYRINGE FOR IV PUSH (FOR BLOOD PRESSURE SUPPORT)
80.0000 ug | PREFILLED_SYRINGE | INTRAVENOUS | Status: DC | PRN
  Filled 2017-08-02: qty 5

## 2017-08-02 MED ORDER — FENTANYL 2.5 MCG/ML W/ROPIVACAINE 0.15% IN NS 100 ML EPIDURAL (ARMC)
12.0000 mL/h | EPIDURAL | Status: DC
  Administered 2017-08-02: 12 mL/h via EPIDURAL

## 2017-08-02 MED ORDER — OXYTOCIN 10 UNIT/ML IJ SOLN
INTRAMUSCULAR | Status: AC
  Filled 2017-08-02: qty 2

## 2017-08-02 NOTE — Progress Notes (Signed)
Angela [name] is a 37 y.o. G2P1001 at [redacted]w[redacted]d by LMP admitted for induction of labor due to Hypertension.  Subjective:  Denies pain or pressure since epidural placed Objective: BP 133/82   Pulse 88   Temp 98 F (36.7 C) (Oral)   Resp 16   Ht 5\' 8"  (1.727 m)   Wt 224 lb (101.6 kg)   LMP 11/09/2016 (Approximate)   SpO2 100%   BMI 34.06 kg/m  No intake/output data recorded. No intake/output data recorded.  FHT:  FHR: 144 bpm, variability: moderate,  accelerations:  Present,  decelerations:  Present variable & prolonged at times, recovery with O2 and position changes UC:   regular, every 3-4 minutes SVE:   Dilation: 8 Effacement (%): 100 Station: -1 Exam by:: K. Veal RN  Labs: Lab Results  Component Value Date   WBC 9.3 08/02/2017   HGB 12.0 08/02/2017   HCT 35.1 08/02/2017   MCV 79.1 (L) 08/02/2017   PLT 285 08/02/2017    Assessment / Plan: Induction of labor due to GTN HTN,  progressing well on pitocin  Labor: Progressing normally Preeclampsia:  labs stable Fetal Wellbeing:  Category II Pain Control:  Epidural I/D:  n/a Anticipated MOD:  NSVD  Angela [name] 08/02/2017, 11:28 PM

## 2017-08-02 NOTE — Anesthesia Procedure Notes (Signed)
Epidural Patient location during procedure: OB Start time: 08/02/2017 8:19 PM End time: 08/02/2017 8:38 PM  Staffing Anesthesiologist: Emmie Niemann, MD Performed: anesthesiologist   Preanesthetic Checklist Completed: patient identified, site marked, surgical consent, pre-op evaluation, timeout performed, IV checked, risks and benefits discussed and monitors and equipment checked  Epidural Patient position: sitting Prep: ChloraPrep Patient monitoring: heart rate, continuous pulse ox and blood pressure Approach: midline Location: L3-L4 Injection technique: LOR saline  Needle:  Needle type: Tuohy  Needle gauge: 18 G Needle length: 9 cm and 9 Needle insertion depth: 5 cm Catheter type: closed end flexible Catheter size: 20 Guage Catheter at skin depth: 9 cm Test dose: negative (0.125% bupivacaine)  Assessment Events: blood not aspirated, injection not painful, no injection resistance, negative IV test and no paresthesia  Additional Notes   Patient tolerated the insertion well without complications.Reason for block:procedure for pain

## 2017-08-02 NOTE — Anesthesia Preprocedure Evaluation (Signed)
Anesthesia Evaluation  Patient identified by MRN, date of birth, ID band Patient awake    Reviewed: Allergy & Precautions, NPO status , Patient's Chart, lab work & pertinent test results  History of Anesthesia Complications (+) history of anesthetic complications (hypotension after prior epidural)  Airway Mallampati: III  TM Distance: >3 FB Neck ROM: Full    Dental no notable dental hx.    Pulmonary asthma ,    breath sounds clear to auscultation- rhonchi (-) wheezing      Cardiovascular hypertension (gHTN), (-) CAD, (-) Past MI, (-) Cardiac Stents and (-) CABG  Rhythm:Regular Rate:Normal - Systolic murmurs and - Diastolic murmurs    Neuro/Psych PSYCHIATRIC DISORDERS Anxiety Depression    GI/Hepatic negative GI ROS, Neg liver ROS,   Endo/Other  negative endocrine ROSneg diabetes  Renal/GU negative Renal ROS     Musculoskeletal negative musculoskeletal ROS (+)   Abdominal (+) + obese, Gravid abdomen   Peds  Hematology negative hematology ROS (+)   Anesthesia Other Findings Past Medical History: No date: Abnormal Pap smear of cervix No date: Anxiety No date: Asthma No date: Complication of anesthesia     Comment:  blood pressure drop No date: Depression No date: Herpes genitalis No date: Vitamin D deficiency   Reproductive/Obstetrics (+) Pregnancy                             Lab Results  Component Value Date   WBC 9.3 08/02/2017   HGB 12.0 08/02/2017   HCT 35.1 08/02/2017   MCV 79.1 (L) 08/02/2017   PLT 285 08/02/2017    Anesthesia Physical Anesthesia Plan  ASA: II  Anesthesia Plan: Epidural   Post-op Pain Management:    Induction:   PONV Risk Score and Plan: 2  Airway Management Planned:   Additional Equipment:   Intra-op Plan:   Post-operative Plan:   Informed Consent: I have reviewed the patients History and Physical, chart, labs and discussed the procedure  including the risks, benefits and alternatives for the proposed anesthesia with the patient or authorized representative who has indicated his/her understanding and acceptance.     Plan Discussed with: CRNA and Anesthesiologist  Anesthesia Plan Comments: (Plan for epidural for labor, discussed epidural vs spinal vs GA if need for csection)        Anesthesia Quick Evaluation

## 2017-08-02 NOTE — H&P (Signed)
Obstetric History and Physical  Angela [name] is a 37 y.o. G2P1001 with IUP at [redacted]w[redacted]d presenting with irregular contractions, moderate headache (eased by tylenol taken at 09:30am) and elevated BP readings at home. Was already scheduled for IOL tonight. . Patient states she has been having  irregular, every 3-6 minutes contractions, none vaginal bleeding, intact membranes, with active fetal movement.    Prenatal Course Source of Care: [facility]  Pregnancy complications or risks:gestational HTN, AMA, HSV9on prophylaxis  Prenatal labs and studies: ABO, Rh: B/Positive/-- (09/20 1439) Antibody: Negative (09/20 1439) Rubella: 4.75 (09/20 1439) RPR: Non Reactive (02/01 1236)  HBsAg: Negative (09/20 1439)  HIV: Non Reactive (09/20 1439)  AST:MHDQQIWL (03/27 1425) 1 hr Glucola  normal Genetic screening normal Anatomy US normal  Past Medical History:  Diagnosis Date  . Abnormal Pap smear of cervix   . Anxiety   . Depression   . Herpes genitalis   . Vitamin D deficiency     Past Surgical History:  Procedure Laterality Date  . APPENDECTOMY    . BREAST REDUCTION SURGERY      OB History  Gravida Para Term Preterm AB Living  2 1 1     1   SAB TAB Ectopic Multiple Live Births          1    # Outcome Date GA Lbr Len/2nd Weight Sex Delivery Anes PTL Lv  2 Current           1 Term 01/08/14 [redacted]w[redacted]d  8 lb 6 oz (3.799 kg) M Vag-Spont  N LIV    Social History   Socioeconomic History  . Marital status: Married    Spouse name: Not on file  . Number of children: Not on file  . Years of education: Not on file  . Highest education level: Not on file  Occupational History  . Not on file  Social Needs  . Financial resource strain: Not on file  . Food insecurity:    Worry: Not on file    Inability: Not on file  . Transportation needs:    Medical: Not on file    Non-medical: Not on file  Tobacco Use  . Smoking status: Never Smoker  . Smokeless tobacco: Never Used  Substance and Sexual  Activity  . Alcohol use: No    Comment: occas  . Drug use: No  . Sexual activity: Yes  Lifestyle  . Physical activity:    Days per week: Not on file    Minutes per session: Not on file  . Stress: Not on file  Relationships  . Social connections:    Talks on phone: Not on file    Gets together: Not on file    Attends religious service: Not on file    Active member of club or organization: Not on file    Attends meetings of clubs or organizations: Not on file    Relationship status: Not on file  Other Topics Concern  . Not on file  Social History Narrative  . Not on file    No family history on file.  Medications Prior to Admission  Medication Sig Dispense Refill Last Dose  . albuterol (PROVENTIL HFA;VENTOLIN HFA) 108 (90 Base) MCG/ACT inhaler Inhale 2 puffs into the lungs every 4 (four) hours as needed for wheezing or shortness of breath. 1 Inhaler 0 Taking  . aspirin EC 81 MG tablet Take 1 tablet (81 mg total) by mouth daily. Take after 12 weeks for prevention of preeclampssia later  in pregnancy (Patient not taking: Reported on 07/28/2017) 300 tablet 2 Not Taking  . azelastine (ASTELIN) 0.1 % nasal spray Place 1 spray into both nostrils 2 (two) times daily. Use in each nostril as directed 30 mL 3 Taking  . cetirizine (ZYRTEC) 10 MG tablet Take 10 mg by mouth daily.   Taking  . docusate sodium (COLACE) 100 MG capsule Take 100 mg by mouth 2 (two) times daily.   Taking  . fluticasone (FLONASE) 50 MCG/ACT nasal spray Place into both nostrils daily.   Taking  . Prenatal Vit-Fe Fumarate-FA (PRENATAL MULTIVITAMIN) TABS tablet Take 1 tablet by mouth daily at 12 noon.   Taking  . ranitidine (ZANTAC 75) 75 MG tablet Take 1 tablet (75 mg total) by mouth 2 (two) times daily. 60 tablet 10 Taking  . valACYclovir (VALTREX) 500 MG tablet Take 1 tablet (500 mg total) by mouth 2 (two) times daily. 60 tablet 2 Taking    No Known Allergies  Review of Systems: Negative except for what is mentioned  in HPI.  Physical Exam: BP 134/88   Pulse 89   Temp 98.2 F (36.8 C) (Oral)   Resp 14   Ht 5\' 8"  (1.727 m)   Wt 224 lb (101.6 kg)   LMP 11/09/2016 (Approximate)   BMI 34.06 kg/m  GENERAL: Well-developed, well-nourished female in no acute distress.  LUNGS: Clear to auscultation bilaterally.  HEART: Regular rate and rhythm. ABDOMEN: Soft, nontender, nondistended, gravid. EXTREMITIES: Nontender, trace edema, 2+ distal pulses. Negative clonus, 1+ reflexes. Cervical Exam: Dilation: 3.5 Effacement (%): 50 Station: -2 Exam by:: M.Shambley CNM  No HSV lesions noted. FHT:  Baseline rate 138 bpm   Variability moderate  Accelerations present   Decelerations none Contractions: Every 3-5 mins, mild to palpation, not painful for patient   Pertinent Labs/Studies:   No results found for this or any previous visit (from the past 24 hour(s)).  Assessment : Angela [name] is a 37 y.o. G2P1001 at [redacted]w[redacted]d being admitted for labor with elevated BP and Headache.  Plan: Labor:  Induction as needed, per protocol FWB: Reassuring fetal heart tracing.  GBS positive Delivery plan: Hopeful for vaginal delivery  Melody Shambley, CNM [facility]

## 2017-08-02 NOTE — Progress Notes (Signed)
Angela [name] is a 37 y.o. G2P1001 at [redacted]w[redacted]d by LMP admitted for induction of labor due to Hypertension.  Subjective: Rates contractions as crampy  Objective: BP (!) 143/90   Pulse 95   Temp 98.5 F (36.9 C) (Oral)   Resp 16   Ht 5\' 8"  (1.727 m)   Wt 224 lb (101.6 kg)   LMP 11/09/2016 (Approximate)   BMI 34.06 kg/m  No intake/output data recorded. No intake/output data recorded.  FHT:  FHR: 135 bpm, variability: moderate,  accelerations:  Present,  decelerations:  Absent UC:   irregular, every 2-3 minutes, moderate to palpation, on 75mu/min pitocin SVE:   Dilation: (P) 4.5 Effacement (%): 50 Station: -2 Exam by:: (P) Shambley, CNM  Labs: Lab Results  Component Value Date   WBC 9.3 08/02/2017   HGB 12.0 08/02/2017   HCT 35.1 08/02/2017   MCV 79.1 (L) 08/02/2017   PLT 285 08/02/2017    Assessment / Plan: Induction of labor due to gestational hypertension,  progressing well on pitocin  Labor: progressing, AROM with moderate amount clear fluid Preeclampsia:  labs stable Fetal Wellbeing:  Category I Pain Control:  Labor support without medications I/D:  n/a Anticipated MOD:  NSVD  [name] 08/02/2017, 6:36 PM

## 2017-08-03 ENCOUNTER — Encounter: Payer: Self-pay | Admitting: *Deleted

## 2017-08-03 DIAGNOSIS — O134 Gestational [pregnancy-induced] hypertension without significant proteinuria, complicating childbirth: Principal | ICD-10-CM

## 2017-08-03 DIAGNOSIS — Z3A38 38 weeks gestation of pregnancy: Secondary | ICD-10-CM

## 2017-08-03 LAB — CBC
HCT: 32.5 % — ABNORMAL LOW (ref 35.0–47.0)
HEMOGLOBIN: 11.1 g/dL — AB (ref 12.0–16.0)
MCH: 27.1 pg (ref 26.0–34.0)
MCHC: 34.1 g/dL (ref 32.0–36.0)
MCV: 79.6 fL — AB (ref 80.0–100.0)
Platelets: 248 10*3/uL (ref 150–440)
RBC: 4.09 MIL/uL (ref 3.80–5.20)
RDW: 14.5 % (ref 11.5–14.5)
WBC: 13.4 10*3/uL — ABNORMAL HIGH (ref 3.6–11.0)

## 2017-08-03 LAB — SYPHILIS: RPR W/REFLEX TO RPR TITER AND TREPONEMAL ANTIBODIES, TRADITIONAL SCREENING AND DIAGNOSIS ALGORITHM: RPR Ser Ql: NONREACTIVE

## 2017-08-03 MED ORDER — DIBUCAINE 1 % RE OINT: 1.0000 "application " | TOPICAL_OINTMENT | RECTAL | Status: DC | PRN

## 2017-08-03 MED ORDER — WITCH HAZEL-GLYCERIN EX PADS: 1.0000 "application " | MEDICATED_PAD | CUTANEOUS | Status: DC | PRN

## 2017-08-03 MED ORDER — ONDANSETRON HCL 4 MG/2ML IJ SOLN: 4.0000 mg | INTRAMUSCULAR | Status: DC | PRN

## 2017-08-03 MED ORDER — IBUPROFEN 600 MG PO TABS
600.0000 mg | ORAL_TABLET | Freq: Four times a day (QID) | ORAL | Status: DC
  Administered 2017-08-03 – 2017-08-04 (×5): 600 mg via ORAL
  Filled 2017-08-03 (×5): qty 1

## 2017-08-03 MED ORDER — ACETAMINOPHEN 325 MG PO TABS: 650.0000 mg | ORAL_TABLET | ORAL | Status: DC | PRN

## 2017-08-03 MED ORDER — SIMETHICONE 80 MG PO CHEW: 80.0000 mg | CHEWABLE_TABLET | ORAL | Status: DC | PRN

## 2017-08-03 MED ORDER — VALACYCLOVIR HCL 500 MG PO TABS
500.0000 mg | ORAL_TABLET | Freq: Two times a day (BID) | ORAL | Status: DC
  Administered 2017-08-03 (×3): 500 mg via ORAL
  Filled 2017-08-03 (×4): qty 1

## 2017-08-03 MED ORDER — DIPHENHYDRAMINE HCL 25 MG PO CAPS: 25.0000 mg | ORAL_CAPSULE | Freq: Four times a day (QID) | ORAL | Status: DC | PRN

## 2017-08-03 MED ORDER — PRENATAL MULTIVITAMIN CH
1.0000 | ORAL_TABLET | Freq: Every day | ORAL | Status: DC
  Administered 2017-08-03: 1 via ORAL
  Filled 2017-08-03: qty 1

## 2017-08-03 MED ORDER — ONDANSETRON HCL 4 MG PO TABS: 4.0000 mg | ORAL_TABLET | ORAL | Status: DC | PRN

## 2017-08-03 MED ORDER — SENNOSIDES-DOCUSATE SODIUM 8.6-50 MG PO TABS: 2.0000 | ORAL_TABLET | ORAL | Status: DC

## 2017-08-03 MED ORDER — COCONUT OIL OIL
1.0000 "application " | TOPICAL_OIL | Status: DC | PRN
  Administered 2017-08-03: 1 via TOPICAL
  Filled 2017-08-03: qty 120

## 2017-08-03 MED ORDER — ZOLPIDEM TARTRATE 5 MG PO TABS: 5.0000 mg | ORAL_TABLET | Freq: Every evening | ORAL | Status: DC | PRN

## 2017-08-03 MED ORDER — BENZOCAINE-MENTHOL 20-0.5 % EX AERO
1.0000 "application " | INHALATION_SPRAY | CUTANEOUS | Status: DC | PRN
  Administered 2017-08-03 (×2): 1 via TOPICAL
  Filled 2017-08-03 (×3): qty 56

## 2017-08-03 NOTE — Lactation Note (Signed)
This note was copied from a baby's chart. Lactation Consultation Note  Patient Name: Angela [name] MIWOE'H Date: 08/03/2017 Reason for consult: Follow-up assessment First child had tongue tie and did not latch well, pumped and bottlefed EBM for 6 mths  Maternal Data Formula Feeding for Exclusion: No Does the patient have breastfeeding experience prior to this delivery?: Yes Hx of breast reduction at 37 yo, mom states they grew back, had no problems with milk production Feeding Feeding Type: Breast Fed Length of feed: (10 min left) Latched to Left breast in side lying position, latches easier to right LATCH Score Latch: Grasps breast easily, tongue down, lips flanged, rhythmical sucking.  Audible Swallowing: Spontaneous and intermittent  Type of Nipple: Flat(breast reduction, baby latches when breast shaped)  Comfort (Breast/Nipple): Soft / non-tender  Hold (Positioning): Assistance needed to correctly position infant at breast and maintain latch.  LATCH Score: 8  Interventions Interventions: Assisted with latch;Hand express;Breast compression;Adjust position;Support pillows  Lactation Tools Discussed/Used WIC Program: No   Consult Status Consult Status: PRN Date: 08/03/17 Follow-up type: In-patient    Ferol Luz 08/03/2017, 3:59 PM

## 2017-08-03 NOTE — Plan of Care (Signed)
Pt. Transferred to room 336 from L&D. Oriented to room, POC and Falls Policy. Pt. V/O

## 2017-08-03 NOTE — Anesthesia Postprocedure Evaluation (Signed)
Anesthesia Post Note  Patient: Angela [name]  Procedure(s) Performed: AN AD [place]  Patient location during evaluation: Mother Baby Anesthesia Type: Epidural Level of consciousness: awake and alert and oriented Pain management: satisfactory to patient Vital Signs Assessment: post-procedure vital signs reviewed and stable Respiratory status: respiratory function stable Cardiovascular status: stable Postop Assessment: no backache, no headache, epidural receding, no apparent nausea or vomiting, patient able to bend at knees and adequate PO intake Anesthetic complications: no     Last Vitals:  Vitals:   08/03/17 0313 08/03/17 0503  BP: 116/75 120/79  Pulse: 85 98  Resp: 18 18  Temp: 36.8 C 36.7 C  SpO2: 97% 98%    Last Pain:  Vitals:   08/03/17 0503  TempSrc: Oral  PainSc:                  Blima Singer

## 2017-08-04 ENCOUNTER — Encounter: Payer: 59 | Admitting: Obstetrics and Gynecology

## 2017-08-04 MED ORDER — WITCH HAZEL-GLYCERIN EX PADS: 1.0000 "application " | MEDICATED_PAD | CUTANEOUS | 12 refills | Status: DC | PRN

## 2017-08-04 MED ORDER — IBUPROFEN 600 MG PO TABS: 600.0000 mg | ORAL_TABLET | Freq: Four times a day (QID) | ORAL | 0 refills | Status: AC

## 2017-08-04 MED ORDER — DIBUCAINE 1 % RE OINT: 1.0000 "application " | TOPICAL_OINTMENT | RECTAL | 1 refills | Status: DC | PRN

## 2017-08-04 NOTE — Discharge Instructions (Signed)

## 2017-08-04 NOTE — Plan of Care (Signed)
Vs stable; up ad lib; breastfeeding well; taking motrin for pain control

## 2017-08-04 NOTE — Discharge Summary (Signed)
Discharge Summary  Date of Admission: 08/02/2017  Date of Discharge: 08/04/2017  Admitting Diagnosis: Induction of labor at [redacted]w[redacted]d  Mode of Delivery: normal spontaneous vaginal delivery                 Discharge Diagnosis: Gestational hypertension   Intrapartum Procedures: epidural and laceration 1st   Post partum procedures: none  Complications: none                      Discharge Day SOAP Note:  Progress Note - Vaginal Delivery  Angela [name] is a 37 y.o. G2P2002 now PP day 1 s/p Vaginal, Spontaneous . Delivery was complicated by gestational hypertension  Subjective  The patient has the following complaints: has no unusual complaints  Pain is controlled with current medications.   Patient is urinating without difficulty.  She is ambulating well.    Objective  Vital signs: BP 115/85 (BP Location: Left Arm)   Pulse 86   Temp 98.2 F (36.8 C) (Oral)   Resp 18   Ht 5\' 8"  (1.727 m)   Wt 224 lb (101.6 kg)   LMP 11/09/2016 (Approximate)   SpO2 100%   Breastfeeding? Unknown   BMI 34.06 kg/m   Physical Exam: Gen: NAD Fundus Fundal Tone: Firm  Lochia Amount: Small  Perineum Appearance: Intact     Data Review Labs: CBC Latest Ref Rng & Units 08/03/2017 08/02/2017 07/28/2017  WBC 3.6 - 11.0 K/uL 13.4(H) 9.3 10.2  Hemoglobin 12.0 - 16.0 g/dL 11.1(L) 12.0 11.7  Hematocrit 35.0 - 47.0 % 32.5(L) 35.1 35.1  Platelets 150 - 440 K/uL 248 285 258   B POS  Assessment/Plan  Active Problems:   Gestational hypertension    Plan for discharge today.   Discharge Instructions: Per After Visit Summary. Activity: Advance as tolerated. Pelvic rest for 6 weeks.  Also refer to After Visit Summary Diet: Regular Medications: Allergies as of 08/04/2017   No Known Allergies     Medication List    STOP taking these medications   aspirin EC 81 MG tablet   ranitidine 75 MG tablet Commonly known as:  ZANTAC 75     TAKE these medications    acetaminophen 325 MG tablet Commonly known as:  TYLENOL Take 650 mg by mouth every 6 (six) hours as needed for headache.   albuterol 108 (90 Base) MCG/ACT inhaler Commonly known as:  PROVENTIL HFA;VENTOLIN HFA Inhale 2 puffs into the lungs every 4 (four) hours as needed for wheezing or shortness of breath.   azelastine 0.1 % nasal spray Commonly known as:  ASTELIN Place 1 spray into both nostrils 2 (two) times daily. Use in each nostril as directed   cetirizine 10 MG tablet Commonly known as:  ZYRTEC Take 10 mg by mouth daily.   dibucaine 1 % Oint Commonly known as:  NUPERCAINAL Place 1 application rectally as needed for hemorrhoids.   docusate sodium 100 MG capsule Commonly known as:  COLACE Take 100 mg by mouth 2 (two) times daily.   fluticasone 50 MCG/ACT nasal spray Commonly known as:  FLONASE Place into both nostrils daily.   ibuprofen 600 MG tablet Commonly known as:  ADVIL,MOTRIN Take 1 tablet (600 mg total) by mouth every 6 (six) hours.   prenatal multivitamin Tabs tablet Take 1 tablet by mouth daily at 12 noon.   valACYclovir 500 MG tablet Commonly known as:  VALTREX Take 1 tablet (500 mg total) by mouth 2 (two) times daily.  witch hazel-glycerin pad Commonly known as:  TUCKS Apply 1 application topically as needed for hemorrhoids.      Outpatient follow up:  Postpartum contraception: planning postpartum tubal. Pt to call for appointment 4 wks pre operative with Dr. Marcelline Mates.   Discharged Condition: good  Discharged to: home  Newborn Data: Disposition:home with mother  Apgars: APGAR (1 MIN): 9   APGAR (5 MINS): 9   APGAR (10 MINS):    Baby Feeding: Breast    Philip Aspen, CNM 08/04/2017 9:25 AM

## 2017-08-04 NOTE — Plan of Care (Signed)
Patient meeting goals for post partum and states she is ready for discharge

## 2017-08-04 NOTE — Final Progress Note (Signed)
Discharge Day SOAP Note:  Progress Note - Vaginal Delivery  Angela [name] is a 37 y.o. G2P2002 now PP day 1 s/p Vaginal, Spontaneous . Delivery was complicated by gestational hypertension  Subjective  The patient has the following complaints: has no unusual complaints  Pain is controlled with current medications.   Patient is urinating without difficulty.  She is ambulating well.    Objective  Vital signs: BP 115/85 (BP Location: Left Arm)   Pulse 86   Temp 98.2 F (36.8 C) (Oral)   Resp 18   Ht 5\' 8"  (1.727 m)   Wt 224 lb (101.6 kg)   LMP 11/09/2016 (Approximate)   SpO2 100%   Breastfeeding? Unknown   BMI 34.06 kg/m   Physical Exam: Gen: NAD Fundus Fundal Tone: Firm  Lochia Amount: Small  Perineum Appearance: Intact     Data Review Labs: CBC Latest Ref Rng & Units 08/03/2017 08/02/2017 07/28/2017  WBC 3.6 - 11.0 K/uL 13.4(H) 9.3 10.2  Hemoglobin 12.0 - 16.0 g/dL 11.1(L) 12.0 11.7  Hematocrit 35.0 - 47.0 % 32.5(L) 35.1 35.1  Platelets 150 - 440 K/uL 248 285 258   B POS  Assessment/Plan  Active Problems:   Gestational hypertension    Plan for discharge today.   Discharge Instructions: Per After Visit Summary. Activity: Advance as tolerated. Pelvic rest for 6 weeks.  Also refer to After Visit Summary Diet: Regular Medications: Allergies as of 08/04/2017   No Known Allergies     Medication List    STOP taking these medications   aspirin EC 81 MG tablet   ranitidine 75 MG tablet Commonly known as:  ZANTAC 75     TAKE these medications   acetaminophen 325 MG tablet Commonly known as:  TYLENOL Take 650 mg by mouth every 6 (six) hours as needed for headache.   albuterol 108 (90 Base) MCG/ACT inhaler Commonly known as:  PROVENTIL HFA;VENTOLIN HFA Inhale 2 puffs into the lungs every 4 (four) hours as needed for wheezing or shortness of breath.   azelastine 0.1 % nasal spray Commonly known as:  ASTELIN Place 1 spray into both  nostrils 2 (two) times daily. Use in each nostril as directed   cetirizine 10 MG tablet Commonly known as:  ZYRTEC Take 10 mg by mouth daily.   dibucaine 1 % Oint Commonly known as:  NUPERCAINAL Place 1 application rectally as needed for hemorrhoids.   docusate sodium 100 MG capsule Commonly known as:  COLACE Take 100 mg by mouth 2 (two) times daily.   fluticasone 50 MCG/ACT nasal spray Commonly known as:  FLONASE Place into both nostrils daily.   ibuprofen 600 MG tablet Commonly known as:  ADVIL,MOTRIN Take 1 tablet (600 mg total) by mouth every 6 (six) hours.   prenatal multivitamin Tabs tablet Take 1 tablet by mouth daily at 12 noon.   valACYclovir 500 MG tablet Commonly known as:  VALTREX Take 1 tablet (500 mg total) by mouth 2 (two) times daily.   witch hazel-glycerin pad Commonly known as:  TUCKS Apply 1 application topically as needed for hemorrhoids.      Outpatient follow up:  Postpartum contraception: planning postpartum tubal. Pt to call for appointment 4 wks pre operative with Dr. Marcelline Mates.   Discharged Condition: good  Discharged to: home  Newborn Data: Disposition:home with mother  Apgars: APGAR (1 MIN): 9   APGAR (5 MINS): 9   APGAR (10 MINS):    Baby Feeding: Breast    Philip Aspen, CNM  08/04/2017 9:25 AM

## 2017-08-04 NOTE — Progress Notes (Signed)
D/C order from prenatal provider---reviewed d/c instructions and answered any questions. Patient d/c home with infant via wheelchair by auxiliary. Marland Kitchen

## 2017-08-05 ENCOUNTER — Telehealth: Payer: Self-pay | Admitting: Obstetrics and Gynecology

## 2017-08-05 NOTE — Telephone Encounter (Signed)
The patient stated that she would like to speak with a nurse in regards to her wanting to know if she is able to drive. Please advise.

## 2017-08-05 NOTE — Telephone Encounter (Signed)
No answer

## 2017-08-06 NOTE — Telephone Encounter (Signed)
Pt is s/p svd 4/15. She is not currently on any pain meds. She is able to sit w/o discomfort. She is able to turn her head/body w/o issues. Advised I see no reason why she can not drive.

## 2017-08-10 ENCOUNTER — Telehealth: Payer: Self-pay | Admitting: Obstetrics and Gynecology

## 2017-08-10 NOTE — Telephone Encounter (Signed)
The patient called and stated that she woke up with a large lump under her arm the size of a golf ball. The pt state that it is painful and would like to have a nurse call her soon, Please advise.

## 2017-08-11 ENCOUNTER — Encounter: Payer: 59 | Admitting: Obstetrics and Gynecology

## 2017-08-11 NOTE — Telephone Encounter (Signed)
Pt is coming in 08/12/17

## 2017-08-12 ENCOUNTER — Ambulatory Visit (INDEPENDENT_AMBULATORY_CARE_PROVIDER_SITE_OTHER): Payer: 59 | Admitting: Obstetrics and Gynecology

## 2017-08-12 ENCOUNTER — Encounter: Payer: Self-pay | Admitting: Obstetrics and Gynecology

## 2017-08-12 VITALS — BP 124/90 | HR 86 | Temp 99.5°F | Ht 68.0 in | Wt 202.0 lb

## 2017-08-12 DIAGNOSIS — N61 Mastitis without abscess: Secondary | ICD-10-CM | POA: Diagnosis not present

## 2017-08-12 MED ORDER — DICLOXACILLIN SODIUM 500 MG PO CAPS: 500.0000 mg | ORAL_CAPSULE | Freq: Two times a day (BID) | ORAL | 1 refills | Status: DC

## 2017-08-12 NOTE — Patient Instructions (Signed)
Mastitis  Mastitis is inflammation of the breast tissue. It occurs most often in women who are breastfeeding, but it can also affect other women, and even sometimes men.  What are the causes?  Mastitis is usually caused by a bacterial infection. Bacteria enter the breast tissue through cuts or openings in the skin. Typically, this occurs with breastfeeding because of cracked or irritated skin. Sometimes, it can occur even when there is no opening in the skin. It can be associated with plugged milk (lactiferous) ducts. Nipple piercing can also lead to mastitis. Also, some forms of breast cancer can cause mastitis.  What are the signs or symptoms?  · Swelling, redness, tenderness, and pain in an area of the breast.  · Swelling of the glands under the arm on the same side.  · Fever.  If an infection is allowed to progress, a collection of pus (abscess) may develop.  How is this diagnosed?  Your health care provider can usually diagnose mastitis based on your symptoms and a physical exam. Tests may be done to help confirm the diagnosis. These may include:  · Removal of pus from the breast by applying pressure to the area. This pus can be examined in the lab to determine which bacteria are present. If an abscess has developed, the fluid in the abscess can be removed with a needle. This can also be used to confirm the diagnosis and determine the bacteria present. In most cases, pus will not be present.  · Blood tests to determine if your body is fighting a bacterial infection.  · Mammogram or ultrasound tests to rule out other problems or diseases.    How is this treated?  Antibiotic medicine is used to treat a bacterial infection. Your health care provider will determine which bacteria are most likely causing the infection and will select an appropriate antibiotic. This is sometimes changed based on the results of tests performed to identify the bacteria, or if there is no response to the antibiotic selected. Antibiotics  are usually given by mouth. You may also be given medicine for pain.  Mastitis that occurs with breastfeeding will sometimes go away on its own, so your health care provider may choose to wait 24 hours after first seeing you to decide whether a prescription medicine is needed.  Follow these instructions at home:  · Only take over-the-counter or prescription medicines for pain, fever, or discomfort as directed by your health care provider.  · If your health care provider prescribed an antibiotic, take the medicine as directed. Make sure you finish it even if you start to feel better.  · Do not wear a tight or underwire bra. Wear a soft, supportive bra.  · Increase your fluid intake, especially if you have a fever.  · Women who are breastfeeding should follow these instructions:  ? Continue to empty the breast. Your health care provider can tell you whether this milk is safe for your infant or needs to be thrown out. You may be told to stop nursing until your health care provider thinks it is safe for your baby. Use a breast pump if you are advised to stop nursing.  ? Keep your nipples clean and dry.  ? Empty the first breast completely before going to the other breast. If your baby is not emptying your breasts completely for some reason, use a breast pump to empty your breasts.  ? If you go back to work, pump your breasts while at work to stay   in time with your nursing schedule.  ? Avoid allowing your breasts to become overly filled with milk (engorged).  Contact a health care provider if:  · You have pus-like discharge from the breast.  · Your symptoms do not improve with the treatment prescribed by your health care provider within 2 days.  Get help right away if:  · Your pain and swelling are getting worse.  · You have pain that is not controlled with medicine.  · You have a red line extending from the breast toward your armpit.  · You have a fever or persistent symptoms for more than 2-3 days.  · You have a fever  and your symptoms suddenly get worse.  This information is not intended to replace advice given to you by your health care provider. Make sure you discuss any questions you have with your health care provider.  Document Released: 04/07/2005 Document Revised: 09/13/2015 Document Reviewed: 11/05/2012  Elsevier Interactive Patient Education © 2017 [employer]

## 2017-08-12 NOTE — Progress Notes (Signed)
Subjective:     Patient ID: Angela [name], female   DOB: Sep 03, 1980, 37 y.o.   MRN: [id number]  HPI   Review of Systems Negative except stated above in HPI    Objective:   Physical Exam A&Ox4 Well grromed female Blood pressure 124/90, pulse 86, temperature 99.5 F (37.5 C), height 5\' 8"  (1.727 m), weight 202 lb (91.6 kg), last menstrual period 11/09/2016, currently breastfeeding.  Breasts: left breast normal without mass, skin or nipple changes or axillary nodes, right breast with erythema on upper outer portion and axilla node enlargement..    Assessment:     Right breast mastitis    Plan:     Dynapen 500mg  qid Instructions on treatment given. RTC as needed.  Melody Shambley,CNM

## 2017-08-27 ENCOUNTER — Encounter: Payer: Self-pay | Admitting: Obstetrics and Gynecology

## 2017-08-31 ENCOUNTER — Encounter: Payer: 59 | Admitting: Obstetrics and Gynecology

## 2017-08-31 ENCOUNTER — Encounter: Payer: Self-pay | Admitting: Obstetrics and Gynecology

## 2017-08-31 ENCOUNTER — Ambulatory Visit (INDEPENDENT_AMBULATORY_CARE_PROVIDER_SITE_OTHER): Payer: 59 | Admitting: Obstetrics and Gynecology

## 2017-08-31 VITALS — BP 121/84 | HR 78 | Ht 68.0 in | Wt 203.5 lb

## 2017-08-31 DIAGNOSIS — B370 Candidal stomatitis: Secondary | ICD-10-CM | POA: Diagnosis not present

## 2017-08-31 DIAGNOSIS — Z01818 Encounter for other preprocedural examination: Secondary | ICD-10-CM

## 2017-08-31 DIAGNOSIS — N61 Mastitis without abscess: Secondary | ICD-10-CM | POA: Diagnosis not present

## 2017-08-31 NOTE — Patient Instructions (Signed)
Laparoscopic Tubal Ligation Laparoscopic tubal ligation is a procedure to close the fallopian tubes. This is done so that you cannot get pregnant. When the fallopian tubes are closed, the eggs that your ovaries release cannot enter the uterus, and sperm cannot reach the released eggs. A laparoscopic tubal ligation is sometimes called "getting your tubes tied." You should not have this procedure if you want to get pregnant someday or if you are unsure about having more children. Tell a health care provider about:  Any allergies you have.  All medicines you are taking, including vitamins, herbs, eye drops, creams, and over-the-counter medicines.  Any problems you or family members have had with anesthetic medicines.  Any blood disorders you have.  Any surgeries you have had.  Any medical conditions you have.  Whether you are pregnant or may be pregnant.  Any past pregnancies. What are the risks? Generally, this is a safe procedure. However, problems may occur, including:  Infection.  Bleeding.  Injury to surrounding organs.  Side effects from anesthetics.  Failure of the procedure.  This procedure can increase your risk of a kind of pregnancy in which a fertilized egg attaches to the outside of the uterus (ectopic pregnancy). What happens before the procedure?  Ask your health care provider about: ? Changing or stopping your regular medicines. This is especially important if you are taking diabetes medicines or blood thinners. ? Taking medicines such as aspirin and ibuprofen. These medicines can thin your blood. Do not take these medicines before your procedure if your health care provider instructs you not to.  Follow instructions from your health care provider about eating and drinking restrictions.  Plan to have someone take you home after the procedure.  If you go home right after the procedure, plan to have someone with you for 24 hours. What happens during the  procedure?  You will be given one or more of the following: ? A medicine to help you relax (sedative). ? A medicine to numb the area (local anesthetic). ? A medicine to make you fall asleep (general anesthetic). ? A medicine that is injected into an area of your body to numb everything below the injection site (regional anesthetic).  An IV tube will be inserted into one of your veins. It will be used to give you medicines and fluids during the procedure.  Your bladder may be emptied with a small tube (catheter).  If you have been given a general anesthetic, a tube will be put down your throat to help you breathe.  Two small cuts (incisions) will be made in your lower abdomen and near your belly button.  Your abdomen will be inflated with a gas. This will let the surgeon see better and will give the surgeon room to work.  A thin, lighted tube (laparoscope) with a camera attached will be inserted into your abdomen through one of the incisions. Small instruments will be inserted through the other incision.  The fallopian tubes will be tied off, burned (cauterized), or blocked with a clip, ring, or clamp. A small portion in the center of each fallopian tube may be removed.  The gas will be released from the abdomen.  The incisions will be closed with stitches (sutures).  A bandage (dressing) will be placed over the incisions. The procedure may vary among health care providers and hospitals. What happens after the procedure?  Your blood pressure, heart rate, breathing rate, and blood oxygen level will be monitored often until the medicines you   were given have worn off.  You will be given medicine to help with pain, nausea, and vomiting as needed. This information is not intended to replace advice given to you by your health care provider. Make sure you discuss any questions you have with your health care provider. Document Released: 07/14/2000 Document Revised: 09/13/2015 Document  Reviewed: 03/18/2015 Elsevier Interactive Patient Education  2018 [employer]  

## 2017-08-31 NOTE — Progress Notes (Signed)
    GYNECOLOGY PROGRESS NOTE  Subjective:    Patient ID: Angela [name], female    DOB: 1980/05/18, 37 y.o.   MRN: [id number]  HPI  Patient is a 37 y.o. [id number] female who is 4 weeks postpartum s/p SVD who presents for pre-op for desired permanent sterilization.    Of note, patient notes that she was recently diagnosed with mastitis in right breast, and is taking a course of dicloxacillin.  Also was seen by Lactation today and was diagnosed with thrush. Currently denies fevers, chills.   The following portions of the patient's history were reviewed and updated as appropriate: allergies, current medications, past family history, past medical history, past social history, past surgical history and problem list.  Review of Systems Pertinent items noted in HPI and remainder of comprehensive ROS otherwise negative.   Objective:   Blood pressure 121/84, pulse 78, height 5\' 8"  (1.727 m), weight 203 lb 8 oz (92.3 kg), last menstrual period 11/09/2016, currently breastfeeding. General appearance: alert and no distress  Breasts: lactating, no erythema or tenderness, nipples normal, no masses bilaterally. Abdomen: soft, non-tender; bowel sounds normal; no masses,  no organomegaly Fundus firm ~ 2 cm above umbilicus.  Pelvic: deferred   Assessment:  Tubal ligation preoperative exam Postpartum state Mastitis Thrush  Plan:  - Patient desires permanent sterilization.  Other reversible forms of contraception were discussed with patient; she declines all other modalities. Risks of procedure discussed with patient including but not limited to: risk of regret, permanence of method, bleeding, infection, injury to surrounding organs and need for additional procedures.  Failure risk of about 1% with increased risk of ectopic gestation if pregnancy occurs was also discussed with patient. Discussed nature and risks of procedure. Patient verbalized understanding of these risks and wants to proceed with  sterilization.  Will schedule for next available OR date, 09/28/2017. Pre-op done today for laparoscopic tubal ligation. Handout given.  - Continue treatments prescribed for mastitis and thrush. Informed to notify office if symptoms persist or get worse closer to surgery date.  - Has final postpartum visit scheduled with Melody Shambley, CNM in 2 weeks.    A total of 15 minutes were spent [name]-to-[name] with the patient during this encounter and over half of that time dealt with counseling and coordination of care.   Rubie Maid, MD [facility]

## 2017-08-31 NOTE — Progress Notes (Signed)
Pt is present today for postpartum care. Pt stated that she is breastfeeding and having no problems. Pt just left her PCP due to having some breast issues and given antibiotic for it. Pt got a 0 on EDS. Pt is doing well no concerns.

## 2017-09-01 ENCOUNTER — Encounter: Payer: Self-pay | Admitting: Obstetrics and Gynecology

## 2017-09-01 NOTE — H&P (View-Only) (Signed)
@[facility] @   GYNECOLOGY PREOPERATIVE HISTORY AND PHYSICAL   Subjective:  Angela [name] is a 37 y.o. [phone] here for surgical management of multiparity, desiring permanent sterilization.  No significant preoperative concerns.  Proposed surgery: Laparoscopic bilateral tubal ligation   Pertinent Gynecological History: Menses: patient has lactational amenorrhea Last pap: normal Date: 2/224/2016   Past Medical History:  Diagnosis Date  . Abnormal Pap smear of cervix   . Anxiety   . Asthma   . Complication of anesthesia    blood pressure drop  . Depression   . Herpes genitalis   . Vitamin D deficiency     OB History  Gravida Para Term Preterm AB Living  2 2 2     2   SAB TAB Ectopic Multiple Live Births        0 2    # Outcome Date GA Lbr Len/2nd Weight Sex Delivery Anes PTL Lv  2 Term 08/03/17 [redacted]w[redacted]d / 00:49 8 lb 14.2 oz (4.03 kg) M Vag-Spont EPI  LIV  1 Term 01/08/14 [redacted]w[redacted]d  8 lb 6 oz (3.799 kg) M Vag-Spont  N LIV    Past Surgical History:  Procedure Laterality Date  . APPENDECTOMY    . BREAST REDUCTION SURGERY      History reviewed. No pertinent family history.    Social History   Socioeconomic History  . Marital status: Married    Spouse name: Not on file  . Number of children: Not on file  . Years of education: Not on file  . Highest education level: Not on file  Occupational History  . Not on file  Social Needs  . Financial resource strain: Not on file  . Food insecurity:    Worry: Not on file    Inability: Not on file  . Transportation needs:    Medical: Not on file    Non-medical: Not on file  Tobacco Use  . Smoking status: Never Smoker  . Smokeless tobacco: Never Used  Substance and Sexual Activity  . Alcohol use: Yes    Comment: occas  . Drug use: No  . Sexual activity: Not Currently    Birth control/protection: Surgical  Lifestyle  . Physical activity:    Days per week: Not on file    Minutes per session: Not on file  . Stress:  Not on file  Relationships  . Social connections:    Talks on phone: Not on file    Gets together: Not on file    Attends religious service: Not on file    Active member of club or organization: Not on file    Attends meetings of clubs or organizations: Not on file    Relationship status: Not on file  . Intimate partner violence:    Fear of current or ex partner: Not on file    Emotionally abused: Not on file    Physically abused: Not on file    Forced sexual activity: Not on file  Other Topics Concern  . Not on file  Social History Narrative  . Not on file   Current Outpatient Medications on File Prior to Visit  Medication Sig Dispense Refill  . acetaminophen (TYLENOL) 325 MG tablet Take 650 mg by mouth every 6 (six) hours as needed for headache.    . albuterol (PROVENTIL HFA;VENTOLIN HFA) 108 (90 Base) MCG/ACT inhaler Inhale 2 puffs into the lungs every 4 (four) hours as needed for wheezing or shortness of breath. 1 Inhaler 0  . azelastine (ASTELIN)  0.1 % nasal spray Place 1 spray into both nostrils 2 (two) times daily. Use in each nostril as directed 30 mL 3  . cetirizine (ZYRTEC) 10 MG tablet Take 10 mg by mouth daily.    . dicloxacillin (DYNAPEN) 500 MG capsule Take 1 capsule (500 mg total) by mouth 2 (two) times daily. 28 capsule 1  . docusate sodium (COLACE) 100 MG capsule Take 100 mg by mouth 2 (two) times daily.    . fluconazole (DIFLUCAN) 100 MG tablet Take 100 mg by mouth daily.    . fluticasone (FLONASE) 50 MCG/ACT nasal spray Place into both nostrils daily.    Marland Kitchen ibuprofen (ADVIL,MOTRIN) 600 MG tablet Take 1 tablet (600 mg total) by mouth every 6 (six) hours. 30 tablet 0  . Lecithin 1200 MG CAPS Take by mouth.    . Prenatal Vit-Fe Fumarate-FA (PRENATAL MULTIVITAMIN) TABS tablet Take 1 tablet by mouth daily at 12 noon.    . dibucaine (NUPERCAINAL) 1 % OINT Place 1 application rectally as needed for hemorrhoids. (Patient not taking: Reported on 08/31/2017) 1 Tube 1  .  valACYclovir (VALTREX) 500 MG tablet Take 1 tablet (500 mg total) by mouth 2 (two) times daily. (Patient not taking: Reported on 08/31/2017) 60 tablet 2  . witch hazel-glycerin (TUCKS) pad Apply 1 application topically as needed for hemorrhoids. (Patient not taking: Reported on 08/12/2017) 40 each 12   No current facility-administered medications on file prior to visit.    No Known Allergies    Review of Systems Constitutional: No recent fever/chills/sweats Respiratory: No recent cough/bronchitis Cardiovascular: No chest pain Gastrointestinal: No recent nausea/vomiting/diarrhea Genitourinary: No UTI symptoms Hematologic/lymphatic:No history of coagulopathy or recent blood thinner use    Objective:   Blood pressure 121/84, pulse 78, height 5\' 8"  (1.727 m), weight 203 lb 8 oz (92.3 kg), last menstrual period 11/09/2016, currently breastfeeding. CONSTITUTIONAL: Well-developed, well-nourished female in no acute distress.  HENT:  Normocephalic, atraumatic, External right and left ear normal. Oropharynx is clear and moist EYES: Conjunctivae and EOM are normal. Pupils are equal, round, and reactive to light. No scleral icterus.  NECK: Normal range of motion, supple, no masses SKIN: Skin is warm and dry. No rash noted. Not diaphoretic. No erythema. No pallor. NEUROLOGIC: Alert and oriented to person, place, and time. Normal reflexes, muscle tone coordination. No cranial nerve deficit noted. PSYCHIATRIC: Normal mood and affect. Normal behavior. Normal judgment and thought content. CARDIOVASCULAR: Normal heart rate noted, regular rhythm RESPIRATORY: Effort and breath sounds normal, no problems with respiration noted ABDOMEN: Soft, nontender, nondistended. PELVIC: Deferred MUSCULOSKELETAL: Normal range of motion. No edema and no tenderness. 2+ distal pulses.    Labs: Lab Results  Component Value Date   WBC 13.4 (H) 08/03/2017   HGB 11.1 (L) 08/03/2017   HCT 32.5 (L) 08/03/2017   MCV 79.6  (L) 08/03/2017   PLT 248 08/03/2017     Imaging Studies: No results found.  Assessment:      Multiparity, desiring permanent sterilization      Plan:    Counseling: Patient desires permanent sterilization.  Other reversible forms of contraception were discussed with patient; she declines all other modalities. Risks of procedure discussed with patient including but not limited to: risk of regret, permanence of method, bleeding, infection, injury to surrounding organs and need for additional procedures.  Failure risk of about 1% with increased risk of ectopic gestation if pregnancy occurs was also discussed with patient.  Patient verbalized understanding of these risks and wants to proceed  with sterilization. Routine postoperative instructions will be reviewed with the patient and her family in detail after surgery.  The patient concurred with the proposed plan, giving informed written consent for the surgery.   Preop testing ordered. Instructions reviewed, including NPO after midnight.     Rubie Maid, MD [facility]

## 2017-09-01 NOTE — H&P (Signed)
@[facility] @   GYNECOLOGY PREOPERATIVE HISTORY AND PHYSICAL   Subjective:  Angela [name] is a 37 y.o. [phone] here for surgical management of multiparity, desiring permanent sterilization.  No significant preoperative concerns.  Proposed surgery: Laparoscopic bilateral tubal ligation   Pertinent Gynecological History: Menses: patient has lactational amenorrhea Last pap: normal Date: 2/224/2016   Past Medical History:  Diagnosis Date  . Abnormal Pap smear of cervix   . Anxiety   . Asthma   . Complication of anesthesia    blood pressure drop  . Depression   . Herpes genitalis   . Vitamin D deficiency     OB History  Gravida Para Term Preterm AB Living  2 2 2     2   SAB TAB Ectopic Multiple Live Births        0 2    # Outcome Date GA Lbr Len/2nd Weight Sex Delivery Anes PTL Lv  2 Term 08/03/17 [redacted]w[redacted]d / 00:49 8 lb 14.2 oz (4.03 kg) M Vag-Spont EPI  LIV  1 Term 01/08/14 [redacted]w[redacted]d  8 lb 6 oz (3.799 kg) M Vag-Spont  N LIV    Past Surgical History:  Procedure Laterality Date  . APPENDECTOMY    . BREAST REDUCTION SURGERY      History reviewed. No pertinent family history.    Social History   Socioeconomic History  . Marital status: Married    Spouse name: Not on file  . Number of children: Not on file  . Years of education: Not on file  . Highest education level: Not on file  Occupational History  . Not on file  Social Needs  . Financial resource strain: Not on file  . Food insecurity:    Worry: Not on file    Inability: Not on file  . Transportation needs:    Medical: Not on file    Non-medical: Not on file  Tobacco Use  . Smoking status: Never Smoker  . Smokeless tobacco: Never Used  Substance and Sexual Activity  . Alcohol use: Yes    Comment: occas  . Drug use: No  . Sexual activity: Not Currently    Birth control/protection: Surgical  Lifestyle  . Physical activity:    Days per week: Not on file    Minutes per session: Not on file  . Stress:  Not on file  Relationships  . Social connections:    Talks on phone: Not on file    Gets together: Not on file    Attends religious service: Not on file    Active member of club or organization: Not on file    Attends meetings of clubs or organizations: Not on file    Relationship status: Not on file  . Intimate partner violence:    Fear of current or ex partner: Not on file    Emotionally abused: Not on file    Physically abused: Not on file    Forced sexual activity: Not on file  Other Topics Concern  . Not on file  Social History Narrative  . Not on file   Current Outpatient Medications on File Prior to Visit  Medication Sig Dispense Refill  . acetaminophen (TYLENOL) 325 MG tablet Take 650 mg by mouth every 6 (six) hours as needed for headache.    . albuterol (PROVENTIL HFA;VENTOLIN HFA) 108 (90 Base) MCG/ACT inhaler Inhale 2 puffs into the lungs every 4 (four) hours as needed for wheezing or shortness of breath. 1 Inhaler 0  . azelastine (ASTELIN)  0.1 % nasal spray Place 1 spray into both nostrils 2 (two) times daily. Use in each nostril as directed 30 mL 3  . cetirizine (ZYRTEC) 10 MG tablet Take 10 mg by mouth daily.    . dicloxacillin (DYNAPEN) 500 MG capsule Take 1 capsule (500 mg total) by mouth 2 (two) times daily. 28 capsule 1  . docusate sodium (COLACE) 100 MG capsule Take 100 mg by mouth 2 (two) times daily.    . fluconazole (DIFLUCAN) 100 MG tablet Take 100 mg by mouth daily.    . fluticasone (FLONASE) 50 MCG/ACT nasal spray Place into both nostrils daily.    Marland Kitchen ibuprofen (ADVIL,MOTRIN) 600 MG tablet Take 1 tablet (600 mg total) by mouth every 6 (six) hours. 30 tablet 0  . Lecithin 1200 MG CAPS Take by mouth.    . Prenatal Vit-Fe Fumarate-FA (PRENATAL MULTIVITAMIN) TABS tablet Take 1 tablet by mouth daily at 12 noon.    . dibucaine (NUPERCAINAL) 1 % OINT Place 1 application rectally as needed for hemorrhoids. (Patient not taking: Reported on 08/31/2017) 1 Tube 1  .  valACYclovir (VALTREX) 500 MG tablet Take 1 tablet (500 mg total) by mouth 2 (two) times daily. (Patient not taking: Reported on 08/31/2017) 60 tablet 2  . witch hazel-glycerin (TUCKS) pad Apply 1 application topically as needed for hemorrhoids. (Patient not taking: Reported on 08/12/2017) 40 each 12   No current facility-administered medications on file prior to visit.    No Known Allergies    Review of Systems Constitutional: No recent fever/chills/sweats Respiratory: No recent cough/bronchitis Cardiovascular: No chest pain Gastrointestinal: No recent nausea/vomiting/diarrhea Genitourinary: No UTI symptoms Hematologic/lymphatic:No history of coagulopathy or recent blood thinner use    Objective:   Blood pressure 121/84, pulse 78, height 5\' 8"  (1.727 m), weight 203 lb 8 oz (92.3 kg), last menstrual period 11/09/2016, currently breastfeeding. CONSTITUTIONAL: Well-developed, well-nourished female in no acute distress.  HENT:  Normocephalic, atraumatic, External right and left ear normal. Oropharynx is clear and moist EYES: Conjunctivae and EOM are normal. Pupils are equal, round, and reactive to light. No scleral icterus.  NECK: Normal range of motion, supple, no masses SKIN: Skin is warm and dry. No rash noted. Not diaphoretic. No erythema. No pallor. NEUROLOGIC: Alert and oriented to person, place, and time. Normal reflexes, muscle tone coordination. No cranial nerve deficit noted. PSYCHIATRIC: Normal mood and affect. Normal behavior. Normal judgment and thought content. CARDIOVASCULAR: Normal heart rate noted, regular rhythm RESPIRATORY: Effort and breath sounds normal, no problems with respiration noted ABDOMEN: Soft, nontender, nondistended. PELVIC: Deferred MUSCULOSKELETAL: Normal range of motion. No edema and no tenderness. 2+ distal pulses.    Labs: Lab Results  Component Value Date   WBC 13.4 (H) 08/03/2017   HGB 11.1 (L) 08/03/2017   HCT 32.5 (L) 08/03/2017   MCV 79.6  (L) 08/03/2017   PLT 248 08/03/2017     Imaging Studies: No results found.  Assessment:      Multiparity, desiring permanent sterilization      Plan:    Counseling: Patient desires permanent sterilization.  Other reversible forms of contraception were discussed with patient; she declines all other modalities. Risks of procedure discussed with patient including but not limited to: risk of regret, permanence of method, bleeding, infection, injury to surrounding organs and need for additional procedures.  Failure risk of about 1% with increased risk of ectopic gestation if pregnancy occurs was also discussed with patient.  Patient verbalized understanding of these risks and wants to proceed  with sterilization. Routine postoperative instructions will be reviewed with the patient and her family in detail after surgery.  The patient concurred with the proposed plan, giving informed written consent for the surgery.   Preop testing ordered. Instructions reviewed, including NPO after midnight.     Rubie Maid, MD [facility]

## 2017-09-15 ENCOUNTER — Ambulatory Visit (INDEPENDENT_AMBULATORY_CARE_PROVIDER_SITE_OTHER): Payer: 59 | Admitting: Obstetrics and Gynecology

## 2017-09-15 ENCOUNTER — Encounter: Payer: Self-pay | Admitting: Obstetrics and Gynecology

## 2017-09-15 NOTE — Progress Notes (Signed)
  Subjective:     Angela [name] is a 37 y.o. female who presents for a postpartum visit. She is 6 weeks postpartum following a spontaneous vaginal delivery. I have fully reviewed the prenatal and intrapartum course. The delivery was at 37 gestational weeks. Outcome: spontaneous vaginal delivery. Anesthesia: epidural. Postpartum course has been uncomplicated. Baby's course has been uncomplicated. Baby is feeding by breast. Bleeding no bleeding. Bowel function is normal. Bladder function is normal. Patient is not sexually active. Contraception method is abstinence and tubal ligation. Postpartum depression screening: negative.  The following portions of the patient's history were reviewed and updated as appropriate: allergies, current medications, past family history, past medical history, past social history, past surgical history and problem list.  Review of Systems Pertinent items noted in HPI and remainder of comprehensive ROS otherwise negative.   Objective:    BP 111/75   Pulse 83   Ht 5\' 8"  (1.727 m)   Wt 201 lb 4.8 oz (91.3 kg)   LMP 11/09/2016 (Approximate)   Breastfeeding? Yes   BMI 30.61 kg/m   General:  alert, cooperative and appears stated age   Breasts:  positive findings: nipple dermatitis bilaterally  Lungs: clear to auscultation bilaterally  Heart:  regular rate and rhythm, S1, S2 normal, no murmur, click, rub or gallop  Abdomen: soft, non-tender; bowel sounds normal; no masses,  no organomegaly and umbilical incision well healed.    Vulva:  normal  Vagina: normal vagina, no discharge, exudate, lesion, or erythema  Cervix:  multiparous appearance  Corpus: normal size, contour, position, consistency, mobility, non-tender  Adnexa:  no mass, fullness, tenderness  Rectal Exam: no masses, tenderness, nodules        Assessment:     6 weeks postpartum exam. Pap smear not done at today's visit.  Bilateral nipple dermatitis Plan:    1. Contraception: tubal ligation 2.  Labs obtained- will follow up accordingly, All purpose nipple cream prescribed x 14 days and as needed 3. Follow up in: 3 months or as needed.

## 2017-09-15 NOTE — Patient Instructions (Signed)
  Place postpartum visit patient instructions here.  

## 2017-09-16 ENCOUNTER — Encounter: Payer: Self-pay | Admitting: Obstetrics and Gynecology

## 2017-09-21 ENCOUNTER — Other Ambulatory Visit: Payer: Self-pay

## 2017-09-21 ENCOUNTER — Encounter
Admission: RE | Admit: 2017-09-21 | Discharge: 2017-09-21 | Disposition: A | Discharge status: Home / Self Care | Payer: 59 | Source: Ambulatory Visit | Attending: Obstetrics and Gynecology | Admitting: Obstetrics and Gynecology

## 2017-09-21 DIAGNOSIS — Z01818 Encounter for other preprocedural examination: Secondary | ICD-10-CM | POA: Insufficient documentation

## 2017-09-21 HISTORY — DX: Other allergic rhinitis: J30.89

## 2017-09-21 HISTORY — DX: Gastro-esophageal reflux disease without esophagitis: K21.9

## 2017-09-21 LAB — CBC
HEMATOCRIT: 38.8 % (ref 35.0–47.0)
HEMOGLOBIN: 13.1 g/dL (ref 12.0–16.0)
MCH: 27.6 pg (ref 26.0–34.0)
MCHC: 33.9 g/dL (ref 32.0–36.0)
MCV: 81.4 fL (ref 80.0–100.0)
Platelets: 273 10*3/uL (ref 150–440)
RBC: 4.76 MIL/uL (ref 3.80–5.20)
RDW: 15.7 % — AB (ref 11.5–14.5)
WBC: 6.1 10*3/uL (ref 3.6–11.0)

## 2017-09-21 NOTE — Patient Instructions (Signed)
Your procedure is scheduled on: 09/28/17 Mon Report to Same Day Surgery 2nd floor medical mall [facility] Entrance-take elevator on left to 2nd floor.  Check in with surgery information desk.) To find out your arrival time please call [phone] between 1PM - 3PM on 09/25/17 fri   Remember: Instructions that are not followed completely may result in serious medical risk, up to and including death, or upon the discretion of your surgeon and anesthesiologist your surgery may need to be rescheduled.    _x___ 1. Do not eat food after midnight the night before your procedure. You may drink clear liquids up to 2 hours before you are scheduled to arrive at the hospital for your procedure.  Do not drink clear liquids within 2 hours of your scheduled arrival to the hospital.  Clear liquids include  --Water or Apple juice without pulp  --Clear carbohydrate beverage such as ClearFast or Gatorade  --Black Coffee or Clear Tea (No milk, no creamers, do not add anything to                  the coffee or Tea Type 1 and type 2 diabetics should only drink water.  No gum chewing or hard candies.     __x__ 2. No Alcohol for 24 hours before or after surgery.   __x__3. No Smoking or e-cigarettes for 24 prior to surgery.  Do not use any chewable tobacco products for at least 6 hour prior to surgery   ____  4. Bring all medications with you on the day of surgery if instructed.    __x__ 5. Notify your doctor if there is any change in your medical condition     (cold, fever, infections).    x___6. On the morning of surgery brush your teeth with toothpaste and water.  You may rinse your mouth with mouth wash if you wish.  Do not swallow any toothpaste or mouthwash.   Do not wear jewelry, make-up, hairpins, clips or nail polish.  Do not wear lotions, powders, or perfumes. You may wear deodorant.  Do not shave 48 hours prior to surgery. Men may shave face and neck.  Do not bring valuables to the hospital.     [facility] is not responsible for any belongings or valuables.               Contacts, dentures or bridgework may not be worn into surgery.  Leave your suitcase in the car. After surgery it may be brought to your room.  For patients admitted to the hospital, discharge time is determined by your                       treatment team.  _  Patients discharged the day of surgery will not be allowed to drive home.  You will need someone to drive you home and stay with you the night of your procedure.    Please read over the following fact sheets that you were given:   [facility] Preparing for Surgery and or MRSA Information   _x___ Take anti-hypertensive listed below, cardiac, seizure, asthma,     anti-reflux and psychiatric medicines. These include:  1. albuterol (PROVENTIL HFA;VENTOLIN HFA) 108 (90 Base) MCG/ACT inhaler if needed  2.cetirizine (ZYRTEC) 10 MG tablet  3.Nasal sprays   4.  5.  6.  ____Fleets enema or Magnesium Citrate as directed.   _x___ Use CHG Soap or sage wipes as directed on instruction sheet  ____ Use inhalers on the day of surgery and bring to hospital day of surgery  ____ Stop Metformin and Janumet 2 days prior to surgery.    ____ Take 1/2 of usual insulin dose the night before surgery and none on the morning     surgery.   _x___ Follow recommendations from Cardiologist, Pulmonologist or PCP regarding          stopping Aspirin, Coumadin, Plavix ,Eliquis, Effient, or Pradaxa, and Pletal.  X____Stop Anti-inflammatories such as Advil, Aleve, Ibuprofen, Motrin, Naproxen, Naprosyn, Goodies powders or aspirin products. OK to take Tylenol and                          Celebrex.   _x___ Stop supplements until after surgery.  But may continue Vitamin D, Vitamin B,       and multivitamin.   ____ Bring C-Pap to the hospital.

## 2017-09-24 ENCOUNTER — Telehealth: Payer: Self-pay | Admitting: Obstetrics and Gynecology

## 2017-09-24 NOTE — Telephone Encounter (Signed)
Charity at [facility] called requesting authorization for this patient's upcoming procedure.  Please contact her at [phone].  Thank you

## 2017-09-28 ENCOUNTER — Ambulatory Visit: Payer: Commercial Managed Care - HMO | Admitting: Anesthesiology

## 2017-09-28 ENCOUNTER — Encounter: Payer: Self-pay | Admitting: Anesthesiology

## 2017-09-28 ENCOUNTER — Ambulatory Visit
Admission: RE | Admit: 2017-09-28 | Discharge: 2017-09-28 | Disposition: A | Discharge status: Home / Self Care | Payer: Commercial Managed Care - HMO | Source: Ambulatory Visit | Attending: Obstetrics and Gynecology | Admitting: Obstetrics and Gynecology

## 2017-09-28 ENCOUNTER — Encounter
Admission: RE | Disposition: A | Discharge status: Home / Self Care | Payer: Self-pay | Source: Ambulatory Visit | Attending: Obstetrics and Gynecology

## 2017-09-28 DIAGNOSIS — Z7951 Long term (current) use of inhaled steroids: Secondary | ICD-10-CM | POA: Diagnosis not present

## 2017-09-28 DIAGNOSIS — O9953 Diseases of the respiratory system complicating the puerperium: Secondary | ICD-10-CM | POA: Diagnosis not present

## 2017-09-28 DIAGNOSIS — Z79899 Other long term (current) drug therapy: Secondary | ICD-10-CM | POA: Diagnosis not present

## 2017-09-28 DIAGNOSIS — J45909 Unspecified asthma, uncomplicated: Secondary | ICD-10-CM | POA: Diagnosis not present

## 2017-09-28 DIAGNOSIS — Z302 Encounter for sterilization: Secondary | ICD-10-CM | POA: Insufficient documentation

## 2017-09-28 DIAGNOSIS — Z9851 Tubal ligation status: Secondary | ICD-10-CM

## 2017-09-28 HISTORY — PX: LAPAROSCOPIC TUBAL LIGATION: SHX1937

## 2017-09-28 LAB — POCT PREGNANCY, URINE: PREG TEST UR: NEGATIVE

## 2017-09-28 SURGERY — LIGATION, FALLOPIAN TUBE, LAPAROSCOPIC
Anesthesia: General | Laterality: Bilateral

## 2017-09-28 MED ORDER — KETOROLAC TROMETHAMINE 30 MG/ML IJ SOLN
INTRAMUSCULAR | Status: AC
  Filled 2017-09-28: qty 1

## 2017-09-28 MED ORDER — SIMETHICONE 80 MG PO CHEW: 80.0000 mg | CHEWABLE_TABLET | Freq: Four times a day (QID) | ORAL | 1 refills | Status: DC | PRN

## 2017-09-28 MED ORDER — ONDANSETRON HCL 4 MG/2ML IJ SOLN
INTRAMUSCULAR | Status: AC
  Filled 2017-09-28: qty 2

## 2017-09-28 MED ORDER — MIDAZOLAM HCL 2 MG/2ML IJ SOLN
INTRAMUSCULAR | Status: DC | PRN
  Administered 2017-09-28: 4 mg via INTRAVENOUS

## 2017-09-28 MED ORDER — DEXAMETHASONE SODIUM PHOSPHATE 10 MG/ML IJ SOLN
INTRAMUSCULAR | Status: DC | PRN
  Administered 2017-09-28: 10 mg via INTRAVENOUS

## 2017-09-28 MED ORDER — OXYCODONE-ACETAMINOPHEN 5-325 MG PO TABS: 1.0000 | ORAL_TABLET | Freq: Four times a day (QID) | ORAL | 0 refills | Status: DC | PRN

## 2017-09-28 MED ORDER — ACETAMINOPHEN 10 MG/ML IV SOLN
INTRAVENOUS | Status: DC | PRN
  Administered 2017-09-28: 1000 mg via INTRAVENOUS

## 2017-09-28 MED ORDER — DEXAMETHASONE SODIUM PHOSPHATE 10 MG/ML IJ SOLN
INTRAMUSCULAR | Status: AC
  Filled 2017-09-28: qty 1

## 2017-09-28 MED ORDER — GLYCOPYRROLATE 0.2 MG/ML IJ SOLN
INTRAMUSCULAR | Status: AC
  Filled 2017-09-28: qty 1

## 2017-09-28 MED ORDER — PROMETHAZINE HCL 25 MG/ML IJ SOLN: 6.2500 mg | INTRAMUSCULAR | Status: DC | PRN

## 2017-09-28 MED ORDER — SUGAMMADEX SODIUM 200 MG/2ML IV SOLN
INTRAVENOUS | Status: AC
  Filled 2017-09-28: qty 2

## 2017-09-28 MED ORDER — LIDOCAINE HCL (CARDIAC) PF 100 MG/5ML IV SOSY
PREFILLED_SYRINGE | INTRAVENOUS | Status: DC | PRN
  Administered 2017-09-28: 50 mg via INTRAVENOUS

## 2017-09-28 MED ORDER — LIDOCAINE HCL (PF) 2 % IJ SOLN
INTRAMUSCULAR | Status: AC
  Filled 2017-09-28: qty 10

## 2017-09-28 MED ORDER — FAMOTIDINE 20 MG PO TABS
ORAL_TABLET | ORAL | Status: AC
  Administered 2017-09-28: 20 mg via ORAL
  Filled 2017-09-28: qty 1

## 2017-09-28 MED ORDER — ROCURONIUM BROMIDE 50 MG/5ML IV SOLN
INTRAVENOUS | Status: AC
  Filled 2017-09-28: qty 1

## 2017-09-28 MED ORDER — ACETAMINOPHEN 10 MG/ML IV SOLN
INTRAVENOUS | Status: AC
  Filled 2017-09-28: qty 100

## 2017-09-28 MED ORDER — BUPIVACAINE HCL (PF) 0.5 % IJ SOLN
INTRAMUSCULAR | Status: AC
  Filled 2017-09-28: qty 30

## 2017-09-28 MED ORDER — HYDROMORPHONE HCL 1 MG/ML IJ SOLN
INTRAMUSCULAR | Status: AC
  Filled 2017-09-28: qty 1

## 2017-09-28 MED ORDER — BUPIVACAINE HCL 0.5 % IJ SOLN
INTRAMUSCULAR | Status: DC | PRN
  Administered 2017-09-28: 7 mL

## 2017-09-28 MED ORDER — LACTATED RINGERS IV SOLN: INTRAVENOUS | Status: DC

## 2017-09-28 MED ORDER — ROCURONIUM BROMIDE 100 MG/10ML IV SOLN
INTRAVENOUS | Status: DC | PRN
  Administered 2017-09-28: 30 mg via INTRAVENOUS

## 2017-09-28 MED ORDER — ONDANSETRON HCL 4 MG/2ML IJ SOLN
INTRAMUSCULAR | Status: DC | PRN
  Administered 2017-09-28: 4 mg via INTRAVENOUS

## 2017-09-28 MED ORDER — MIDAZOLAM HCL 2 MG/2ML IJ SOLN
INTRAMUSCULAR | Status: AC
  Filled 2017-09-28: qty 4

## 2017-09-28 MED ORDER — LACTATED RINGERS IV SOLN
INTRAVENOUS | Status: DC
  Administered 2017-09-28: 15:00:00 via INTRAVENOUS

## 2017-09-28 MED ORDER — FAMOTIDINE 20 MG PO TABS
20.0000 mg | ORAL_TABLET | Freq: Once | ORAL | Status: AC
  Administered 2017-09-28: 20 mg via ORAL

## 2017-09-28 MED ORDER — SUGAMMADEX SODIUM 200 MG/2ML IV SOLN
INTRAVENOUS | Status: DC | PRN
  Administered 2017-09-28: 200 mg via INTRAVENOUS

## 2017-09-28 MED ORDER — FENTANYL CITRATE (PF) 100 MCG/2ML IJ SOLN: 25.0000 ug | INTRAMUSCULAR | Status: DC | PRN

## 2017-09-28 MED ORDER — GLYCOPYRROLATE 0.2 MG/ML IJ SOLN
INTRAMUSCULAR | Status: DC | PRN
  Administered 2017-09-28: 0.1 mg via INTRAVENOUS

## 2017-09-28 MED ORDER — LACTATED RINGERS IV SOLN
INTRAVENOUS | Status: DC | PRN
  Administered 2017-09-28 (×2): via INTRAVENOUS

## 2017-09-28 MED ORDER — PROPOFOL 10 MG/ML IV BOLUS
INTRAVENOUS | Status: DC | PRN
  Administered 2017-09-28: 150 mg via INTRAVENOUS
  Administered 2017-09-28: 50 mg via INTRAVENOUS

## 2017-09-28 MED ORDER — HYDROMORPHONE HCL 1 MG/ML IJ SOLN
INTRAMUSCULAR | Status: DC | PRN
  Administered 2017-09-28: 1 mg via INTRAVENOUS

## 2017-09-28 SURGICAL SUPPLY — 25 items
BLADE SURG SZ11 CARB STEEL (BLADE) ×3 IMPLANT
CATH ROBINSON RED A/P 16FR (CATHETERS) ×3 IMPLANT
CHLORAPREP W/TINT 26ML (MISCELLANEOUS) ×3 IMPLANT
DERMABOND ADVANCED (GAUZE/BANDAGES/DRESSINGS) ×2
DERMABOND ADVANCED .7 DNX12 (GAUZE/BANDAGES/DRESSINGS) ×1 IMPLANT
GLOVE BIO SURGEON STRL SZ 6.5 (GLOVE) ×2 IMPLANT
GLOVE BIO SURGEONS STRL SZ 6.5 (GLOVE) ×1
GLOVE INDICATOR 7.0 STRL GRN (GLOVE) ×3 IMPLANT
GOWN STRL REUS W/ TWL LRG LVL3 (GOWN DISPOSABLE) ×2 IMPLANT
GOWN STRL REUS W/TWL LRG LVL3 (GOWN DISPOSABLE) ×4
KIT PINK PAD W/HEAD ARE REST (MISCELLANEOUS) ×3
KIT PINK PAD W/HEAD ARM REST (MISCELLANEOUS) ×1 IMPLANT
KIT TURNOVER CYSTO (KITS) ×3 IMPLANT
LABEL OR SOLS (LABEL) ×3 IMPLANT
NS IRRIG 500ML POUR BTL (IV SOLUTION) ×3 IMPLANT
PACK GYN LAPAROSCOPIC (MISCELLANEOUS) ×3 IMPLANT
PAD OB MATERNITY 4.3X12.25 (PERSONAL CARE ITEMS) ×3 IMPLANT
PAD PREP 24X41 OB/GYN DISP (PERSONAL CARE ITEMS) ×3 IMPLANT
SHEARS ENDO 5MM LNG  ASK BEFOR (MISCELLANEOUS)
SHEARS ENDO 5MM LNG ASK BEFOR (MISCELLANEOUS) IMPLANT
SUT VIC AB 0 CT2 27 (SUTURE) ×3 IMPLANT
SUT VIC AB 4-0 SH 27 (SUTURE) ×2
SUT VIC AB 4-0 SH 27XANBCTRL (SUTURE) ×1 IMPLANT
TROCAR ENDO BLADELESS 11MM (ENDOMECHANICALS) ×3 IMPLANT
TUBING INSUFFLATION (TUBING) ×3 IMPLANT

## 2017-09-28 NOTE — Transfer of Care (Signed)
Immediate Anesthesia Transfer of Care Note  Patient: Angela [name]  Procedure(s) Performed: LAPAROSCOPIC BILATERAL TUBAL LIGATION (Bilateral )  Patient Location: PACU  Anesthesia Type:General  Level of Consciousness: awake  Airway & Oxygen Therapy: Patient connected to face mask oxygen  Post-op Assessment: Post -op Vital signs reviewed and stable  Post vital signs: stable  Last Vitals:  Vitals Value Taken Time  BP 120/67 09/28/2017  5:32 PM  Temp    Pulse 66 09/28/2017  5:32 PM  Resp 13 09/28/2017  5:32 PM  SpO2 100 % 09/28/2017  5:32 PM  Vitals shown include unvalidated device data.  Last Pain:  Vitals:   09/28/17 1531  TempSrc: Oral         Complications: No apparent anesthesia complications

## 2017-09-28 NOTE — Anesthesia Procedure Notes (Addendum)
Procedure Name: Intubation Date/Time: 09/28/2017 4:28 PM Performed by: Nile Riggs, CRNA Pre-anesthesia Checklist: Patient identified, Emergency Drugs available, Suction available, Patient being monitored and Timeout performed Patient Re-evaluated:Patient Re-evaluated prior to induction Oxygen Delivery Method: Circle system utilized Preoxygenation: Pre-oxygenation with 100% oxygen Induction Type: IV induction Ventilation: Mask ventilation without difficulty Laryngoscope Size: Miller and 2 Grade View: Grade I Tube type: Oral Tube size: 7.0 mm Number of attempts: 1 Airway Equipment and Method: Stylet Placement Confirmation: ETT inserted through vocal cords under direct vision,  positive ETCO2,  CO2 detector and breath sounds checked- equal and bilateral Secured at: 21 cm Tube secured with: Tape Dental Injury: Teeth and Oropharynx as per pre-operative assessment

## 2017-09-28 NOTE — Op Note (Signed)
Procedure(s): LAPAROSCOPIC BILATERAL TUBAL LIGATION Procedure Note  Angela [name] female 37 y.o. 09/28/2017  Indications: The patient is a 37 y.o. [id number] female with   Pre-operative Diagnosis:   Post-operative Diagnosis:   Surgeon: Rubie Maid, MD  Assistants:   Anesthesia: General endotracheal anesthesia  ASA Class:   Procedure Details: The patient was seen in the Holding Room. The risks, benefits, complications, treatment options, and expected outcomes were discussed with the patient.  The patient concurred with the proposed plan, giving informed consent.  The site of surgery properly noted/marked. The patient was taken to the Operating Room, identified as [name] and the procedure verified as Procedure(s) (LRB): LAPAROSCOPIC BILATERAL TUBAL LIGATION (Bilateral).   She was then placed under general anesthesia without difficulty. She was placed in the dorsal lithotomy position, and was prepped and draped in a sterile manner. A bladder catheterization was not performed as patient had voided just prior to the procedure.  After an adequate timeout was performed, a bivalved speculum was then placed in the patient's vagina, and the anterior lip of cervix grasped with the single-tooth tenaculum.  The uterine manipulator was then advanced into the uterus.  The speculum was removed from the vagina.  Attention was then turned to the patient's abdomen where a 11-mm skin incision was made in the umbilical fold.  The Optiview 11-mm trocar and sleeve were then advanced without difficulty with the laparoscope under direct visualization into the abdomen.  The abdomen was then insufflated with carbon dioxide gas and adequate pneumoperitoneum was obtained.  A survey of the patient's pelvis and abdomen revealed entirely normal anatomy.   The fallopian tubes were observed and found to be normal in appearance. Bipolar forceps was then advanced through the operative port and used to coagulate  a 3 cm portion of the left tube in the mid isthmic area.  Good blanching and coagulation was noted at the site of the application.  There was no bleeding noted in the mesosalpinx.  A similar process was carried out on the right fallopian tube allowing for bilateral tubal sterilization.   Scissors were used to incise the coagulated segments of the fallopian tube bilaterally. Good hemostasis was noted overall. The instruments were then removed from the patient's abdomen and the fascial incision was repaired with 0 Vicryl, and the skin was closed with Dermabond. Approximately 10 cc of 0.5% Sensorcaine was injected at the incision site.  The uterine manipulator and the tenaculum were removed from the vagina without complications. The patient tolerated the procedure well.  Sponge, lap, and needle counts were correct times two.  The patient was then taken to the recovery room awake, extubated and in stable condition.  Findings: The uterus, fallopian tubes and ovaries appeared normal.   Estimated Blood Loss:  5 ml       Drains: None         Total IV Fluids:   1000 ml  Specimens: None         Implants: None         Complications:  None; patient tolerated the procedure well.         Disposition: PACU - hemodynamically stable.         Condition: stable   Rubie Maid, MD [facility]

## 2017-09-28 NOTE — Anesthesia Postprocedure Evaluation (Signed)
Anesthesia Post Note  Patient: Angela [name]  Procedure(s) Performed: LAPAROSCOPIC BILATERAL TUBAL LIGATION (Bilateral )  Patient location during evaluation: PACU Anesthesia Type: General Level of consciousness: awake and alert Pain management: pain level controlled Vital Signs Assessment: post-procedure vital signs reviewed and stable Respiratory status: spontaneous breathing, nonlabored ventilation, respiratory function stable and patient connected to nasal cannula oxygen Cardiovascular status: blood pressure returned to baseline and stable Postop Assessment: no apparent nausea or vomiting Anesthetic complications: no     Last Vitals:  Vitals:   09/28/17 1806 09/28/17 1815  BP: 121/77 120/68  Pulse: (!) 54 (!) 52  Resp:  20  Temp:    SpO2: 100% 98%    Last Pain:  Vitals:   09/28/17 1806  TempSrc:   PainSc: 0-No pain                 Martha Clan

## 2017-09-28 NOTE — Interval H&P Note (Signed)
History and Physical Interval Note:  09/28/2017 3:54 PM  Angela [name]  has presented today for surgery, with the diagnosis of DESIRES STERILIZATION  The various methods of treatment have been discussed with the patient and family. After consideration of risks, benefits and other options for treatment, the patient has consented to  Procedure(s): LAPAROSCOPIC BILATERAL TUBAL LIGATION (Bilateral) as a surgical intervention .  The patient's history has been reviewed, patient examined, no change in status, stable for surgery.  I have reviewed the patient's chart and labs.  Questions were answered to the patient's satisfaction.     Rubie Maid, MD [facility]

## 2017-09-28 NOTE — Anesthesia Post-op Follow-up Note (Signed)
Anesthesia QCDR form completed.        

## 2017-09-28 NOTE — Anesthesia Preprocedure Evaluation (Addendum)
Anesthesia Evaluation  Patient identified by MRN, date of birth, ID band Patient awake    Reviewed: Allergy & Precautions, NPO status , Patient's Chart, lab work & pertinent test results  History of Anesthesia Complications (+) history of anesthetic complications (hypotension after prior epidural)  Airway Mallampati: I  TM Distance: >3 FB Neck ROM: Full    Dental  (+) Caps, Dental Advidsory Given, Teeth Intact Permanent retainer on bottom:   Pulmonary neg shortness of breath, asthma , neg recent URI,    breath sounds clear to auscultation- rhonchi (-) wheezing      Cardiovascular hypertension (gHTN), (-) angina(-) CAD, (-) Past MI, (-) Cardiac Stents and (-) CABG (-) dysrhythmias (-) Valvular Problems/Murmurs Rhythm:Regular Rate:Normal - Systolic murmurs and - Diastolic murmurs    Neuro/Psych PSYCHIATRIC DISORDERS Anxiety Depression    GI/Hepatic Neg liver ROS, GERD  ,  Endo/Other  negative endocrine ROSneg diabetes  Renal/GU negative Renal ROS     Musculoskeletal negative musculoskeletal ROS (+)   Abdominal (+) + obese, Gravid abdomen   Peds  Hematology negative hematology ROS (+)   Anesthesia Other Findings Past Medical History: No date: Abnormal Pap smear of cervix No date: Anxiety No date: Asthma No date: Complication of anesthesia     Comment:  blood pressure drop No date: Depression No date: Herpes genitalis No date: Vitamin D deficiency   Reproductive/Obstetrics (+) Breast feeding                             Lab Results  Component Value Date   WBC 6.1 09/21/2017   HGB 13.1 09/21/2017   HCT 38.8 09/21/2017   MCV 81.4 09/21/2017   PLT 273 09/21/2017    Anesthesia Physical  Anesthesia Plan  ASA: II  Anesthesia Plan: General   Post-op Pain Management:    Induction: Intravenous  PONV Risk Score and Plan: 2 and Ondansetron, Dexamethasone and Midazolam  Airway  Management Planned: Oral ETT  Additional Equipment:   Intra-op Plan:   Post-operative Plan: Extubation in OR  Informed Consent: I have reviewed the patients History and Physical, chart, labs and discussed the procedure including the risks, benefits and alternatives for the proposed anesthesia with the patient or authorized representative who has indicated his/her understanding and acceptance.     Plan Discussed with: CRNA and Anesthesiologist  Anesthesia Plan Comments: (Plan for epidural for labor, discussed epidural vs spinal vs GA if need for csection)       Anesthesia Quick Evaluation

## 2017-09-28 NOTE — Discharge Instructions (Signed)
Laparoscopic Tubal Ligation, Care After °Refer to this sheet in the next few weeks. These instructions provide you with information about caring for yourself after your procedure. Your health care provider may also give you more specific instructions. Your treatment has been planned according to current medical practices, but problems sometimes occur. Call your health care provider if you have any problems or questions after your procedure. °What can I expect after the procedure? °After the procedure, it is common to have: °· A sore throat. °· Discomfort in your shoulder. °· Mild discomfort or cramping in your abdomen. °· Gas pains. °· Pain or soreness in the area where the surgical cut (incision) was made. °· A bloated feeling. °· Tiredness. °· Nausea. °· Vomiting. ° °Follow these instructions at home: °Medicines °· Take over-the-counter and prescription medicines only as told by your health care provider. °· Do not take aspirin because it can cause bleeding. °· Do not drive or operate heavy machinery while taking prescription pain medicine. °Activity °· Rest for the rest of the day. °· Return to your normal activities as told by your health care provider. Ask your health care provider what activities are safe for you. °Incision care ° °· Follow instructions from your health care provider about how to take care of your incision. Make sure you: °? Wash your hands with soap and water before you change your bandage (dressing). If soap and water are not available, use hand sanitizer. °? Change your dressing as told by your health care provider. °? Leave stitches (sutures) in place. They may need to stay in place for 2 weeks or longer. °· Check your incision area every day for signs of infection. Check for: °? More redness, swelling, or pain. °? More fluid or blood. °? Warmth. °? Pus or a bad smell. °Other Instructions °· Do not take baths, swim, or use a hot tub until your health care provider approves. You may take  showers. °· Keep all follow-up visits as told by your health care provider. This is important. °· Have someone help you with your daily household tasks for the first few days. °Contact a health care provider if: °· You have more redness, swelling, or pain around your incision. °· Your incision feels warm to the touch. °· You have pus or a bad smell coming from your incision. °· The edges of your incision break open after the sutures have been removed. °· Your pain does not improve after 2-3 days. °· You have a rash. °· You repeatedly become dizzy or light-headed. °· Your pain medicine is not helping. °· You are constipated. °Get help right away if: °· You have a fever. °· You faint. °· You have increasing pain in your abdomen. °· You have severe pain in one or both of your shoulders. °· You have fluid or blood coming from your sutures or from your vagina. °· You have shortness of breath or difficulty breathing. °· You have chest pain or leg pain. °· You have ongoing nausea, vomiting, or diarrhea. °This information is not intended to replace advice given to you by your health care provider. Make sure you discuss any questions you have with your health care provider. °Document Released: 10/25/2004 Document Revised: 09/10/2015 Document Reviewed: 03/18/2015 °Elsevier Interactive Patient Education © 2018 [employer] ° ° °AMBULATORY SURGERY  °DISCHARGE INSTRUCTIONS ° ° °1) The drugs that you were given will stay in your system until tomorrow so for the next 24 hours you should not: ° °A) Drive   an automobile °B) Make any legal decisions °C) Drink any alcoholic beverage ° ° °2) You may resume regular meals tomorrow.  Today it is better to start with liquids and gradually work up to solid foods. ° °You may eat anything you prefer, but it is better to start with liquids, then soup and crackers, and gradually work up to solid foods. ° ° °3) Please notify your doctor immediately if you have any unusual bleeding, trouble  breathing, redness and pain at the surgery site, drainage, fever, or pain not relieved by medication. ° ° ° °4) Additional Instructions: ° ° ° ° ° ° ° °Please contact your physician with any problems or Same Day Surgery at [phone], Monday through Friday 6 am to 4 pm, or [facility] number at [phone]. °

## 2017-09-29 ENCOUNTER — Encounter: Payer: Self-pay | Admitting: Obstetrics and Gynecology

## 2017-09-29 NOTE — Telephone Encounter (Signed)
Charity was called no answer left message to call the office.

## 2017-10-01 ENCOUNTER — Encounter: Payer: Self-pay | Admitting: Obstetrics and Gynecology

## 2017-10-01 ENCOUNTER — Ambulatory Visit (INDEPENDENT_AMBULATORY_CARE_PROVIDER_SITE_OTHER): Payer: 59 | Admitting: Obstetrics and Gynecology

## 2017-10-01 VITALS — BP 113/73 | HR 73 | Ht 68.0 in | Wt 198.2 lb

## 2017-10-01 DIAGNOSIS — Z9889 Other specified postprocedural states: Secondary | ICD-10-CM

## 2017-10-01 DIAGNOSIS — Z9851 Tubal ligation status: Secondary | ICD-10-CM

## 2017-10-01 NOTE — Progress Notes (Signed)
Pt is present today for a wound check. Pt stated that she is not in any pain. No concerns.

## 2017-10-01 NOTE — Progress Notes (Signed)
    OBSTETRICS/GYNECOLOGY POST-OPERATIVE CLINIC VISIT  Subjective:     Angela [name] is a 37 y.o.  [id number] female who presents to the clinic 1 weeks status post laparoscopic bilateral tubal ligation for requested sterilization. Eating a regular diet without difficulty. Bowel movements are normal. Pain is controlled with current analgesics. Medications being used: prescription NSAID's including ibuprofen (Motrin).  The following portions of the patient's history were reviewed and updated as appropriate: allergies, current medications, past family history, past medical history, past social history, past surgical history and problem list.  Review of Systems Pertinent items noted in HPI and remainder of comprehensive ROS otherwise negative.    Objective:    BP 113/73   Pulse 73   Ht 5\' 8"  (1.727 m)   Wt 198 lb 3.2 oz (89.9 kg)   LMP 09/12/2017   Breastfeeding? Yes   BMI 30.14 kg/m  General:  alert and no distress  Abdomen: soft, bowel sounds active, non-tender  Incision:   healing well, no drainage, no erythema, no hernia, no seroma, no swelling, no dehiscence, incision well approximated    Pathology:  None  Assessment:    Doing well postoperatively.  S/p laparoscopic BTL   Plan:   1. Continue any current medications. 2. Wound care discussed. 3. Operative findings again reviewed.  4. Activity restrictions: none 5. Anticipated return to work: 1-2 weeks (still in postpartum period). 6. Follow up: patient has appointment in August for annual exam with Melody Gayla Medicus, CNM   Rubie Maid, MD [facility]

## 2017-10-02 ENCOUNTER — Other Ambulatory Visit: Payer: Self-pay | Admitting: Obstetrics and Gynecology

## 2017-10-09 NOTE — Telephone Encounter (Signed)
Called and information was given.

## 2017-11-03 ENCOUNTER — Other Ambulatory Visit: Payer: Self-pay | Admitting: Obstetrics and Gynecology

## 2017-11-03 ENCOUNTER — Encounter: Payer: Self-pay | Admitting: Obstetrics and Gynecology

## 2017-11-03 MED ORDER — FLUCONAZOLE 150 MG PO TABS: 150.0000 mg | ORAL_TABLET | Freq: Every day | ORAL | 1 refills | Status: DC

## 2017-11-22 ENCOUNTER — Encounter: Payer: Self-pay | Admitting: Obstetrics and Gynecology

## 2017-12-16 ENCOUNTER — Ambulatory Visit (INDEPENDENT_AMBULATORY_CARE_PROVIDER_SITE_OTHER): Payer: 59 | Admitting: Obstetrics and Gynecology

## 2017-12-16 ENCOUNTER — Other Ambulatory Visit (HOSPITAL_COMMUNITY)
Admission: RE | Admit: 2017-12-16 | Discharge: 2017-12-16 | Disposition: A | Discharge status: Home / Self Care | Payer: 59 | Source: Ambulatory Visit | Attending: Obstetrics and Gynecology | Admitting: Obstetrics and Gynecology

## 2017-12-16 ENCOUNTER — Encounter: Payer: Self-pay | Admitting: Obstetrics and Gynecology

## 2017-12-16 VITALS — BP 120/82 | HR 75 | Ht 68.0 in | Wt 191.7 lb

## 2017-12-16 DIAGNOSIS — L600 Ingrowing nail: Secondary | ICD-10-CM | POA: Insufficient documentation

## 2017-12-16 DIAGNOSIS — J45909 Unspecified asthma, uncomplicated: Secondary | ICD-10-CM | POA: Insufficient documentation

## 2017-12-16 DIAGNOSIS — Z7951 Long term (current) use of inhaled steroids: Secondary | ICD-10-CM | POA: Diagnosis not present

## 2017-12-16 DIAGNOSIS — F329 Major depressive disorder, single episode, unspecified: Secondary | ICD-10-CM | POA: Insufficient documentation

## 2017-12-16 DIAGNOSIS — Z79899 Other long term (current) drug therapy: Secondary | ICD-10-CM | POA: Diagnosis not present

## 2017-12-16 DIAGNOSIS — K219 Gastro-esophageal reflux disease without esophagitis: Secondary | ICD-10-CM | POA: Diagnosis not present

## 2017-12-16 DIAGNOSIS — Z01419 Encounter for gynecological examination (general) (routine) without abnormal findings: Secondary | ICD-10-CM | POA: Diagnosis present

## 2017-12-16 DIAGNOSIS — E559 Vitamin D deficiency, unspecified: Secondary | ICD-10-CM | POA: Diagnosis not present

## 2017-12-16 DIAGNOSIS — Z01411 Encounter for gynecological examination (general) (routine) with abnormal findings: Secondary | ICD-10-CM | POA: Diagnosis not present

## 2017-12-16 DIAGNOSIS — F419 Anxiety disorder, unspecified: Secondary | ICD-10-CM | POA: Diagnosis not present

## 2017-12-16 MED ORDER — RANITIDINE HCL 150 MG PO TABS: 150.0000 mg | ORAL_TABLET | Freq: Two times a day (BID) | ORAL | 1 refills | Status: DC

## 2017-12-16 NOTE — Progress Notes (Signed)
Subjective:   Angela [name] is a 37 y.o. [id number] Caucasian female here for a routine well-woman exam.  Patient's last menstrual period was 11/22/2017.    Current complaints: increased stress with work load and 2 small kids. Feels like there is a lump in her throat all the time. Not relieved with twice a day zantac.  Feels like it is stress related.  PCP: Clemmie Krill       does desire labs  Social History: Sexual: heterosexual Marital Status: married Living situation: with family Occupation: peds Electronics engineer Tobacco/alcohol: no tobacco use Illicit drugs: no history of illicit drug use  The following portions of the patient's history were reviewed and updated as appropriate: allergies, current medications, past family history, past medical history, past social history, past surgical history and problem list.  Past Medical History Past Medical History:  Diagnosis Date  . Abnormal Pap smear of cervix   . Anxiety   . Asthma   . Complication of anesthesia    blood pressure drop  . Depression   . Environmental and seasonal allergies   . GERD (gastroesophageal reflux disease)   . Herpes genitalis   . Vitamin D deficiency     Past Surgical History Past Surgical History:  Procedure Laterality Date  . APPENDECTOMY    . BREAST REDUCTION SURGERY    . LAPAROSCOPIC TUBAL LIGATION Bilateral 09/28/2017   Procedure: LAPAROSCOPIC BILATERAL TUBAL LIGATION;  Surgeon: Rubie Maid, MD;  Location: [facility] ORS;  Service: Gynecology;  Laterality: Bilateral;  . MOUTH SURGERY    . skin cyst    . TONSILLECTOMY      Gynecologic History [id number]  Patient's last menstrual period was 11/22/2017. Contraception: tubal ligation Last Pap: 2016. Results were: normal   Obstetric History OB History  Gravida Para Term Preterm AB Living  2 2 2     2   SAB TAB Ectopic Multiple Live Births        0 2    # Outcome Date GA Lbr Len/2nd Weight Sex Delivery Anes PTL Lv  2 Term 08/03/17 [redacted]w[redacted]d / 00:49 8 lb  14.2 oz (4.03 kg) M Vag-Spont EPI  LIV  1 Term 01/08/14 [redacted]w[redacted]d  8 lb 6 oz (3.799 kg) M Vag-Spont  N LIV    Current Medications Current Outpatient Medications on File Prior to Visit  Medication Sig Dispense Refill  . acetaminophen (TYLENOL) 325 MG tablet Take 650 mg by mouth every 6 (six) hours as needed for headache.    . albuterol (PROVENTIL HFA;VENTOLIN HFA) 108 (90 Base) MCG/ACT inhaler Inhale 2 puffs into the lungs every 4 (four) hours as needed for wheezing or shortness of breath. 1 Inhaler 0  . azelastine (ASTELIN) 0.1 % nasal spray Place 1 spray into both nostrils daily. 30 mL 2  . cetirizine (ZYRTEC) 10 MG tablet Take 10 mg by mouth daily.    . fluticasone (FLONASE) 50 MCG/ACT nasal spray Place 1 spray into both nostrils daily.     Marland Kitchen ketotifen (ZADITOR) 0.025 % ophthalmic solution Place 1 drop into both eyes daily as needed (allergies).    . Lecithin 1200 MG CAPS Take 1,200 mg by mouth 2 (two) times daily.     . Prenatal Vit-Fe Fumarate-FA (PRENATAL MULTIVITAMIN) TABS tablet Take 1 tablet by mouth daily at 12 noon.    . docusate sodium (COLACE) 100 MG capsule Take 100 mg by mouth daily as needed for moderate constipation.     . fluconazole (DIFLUCAN) 150 MG tablet Take 1 tablet (  150 mg total) by mouth daily. 7 tablet 1  . ibuprofen (ADVIL,MOTRIN) 600 MG tablet Take 1 tablet (600 mg total) by mouth every 6 (six) hours. (Patient not taking: Reported on 12/16/2017) 30 tablet 0  . oxyCODONE-acetaminophen (PERCOCET/ROXICET) 5-325 MG tablet Take 1-2 tablets by mouth every 6 (six) hours as needed for moderate pain or severe pain. (Patient not taking: Reported on 10/01/2017) 10 tablet 0  . simethicone (GAS-X) 80 MG chewable tablet Chew 1 tablet (80 mg total) by mouth 4 (four) times daily as needed for flatulence (bloating). 30 tablet 1  . valACYclovir (VALTREX) 500 MG tablet Take 1 tablet (500 mg total) by mouth 2 (two) times daily. (Patient not taking: Reported on 09/16/2017) 60 tablet 2   No  current facility-administered medications on file prior to visit.     Review of Systems Patient denies any headaches, blurred vision, shortness of breath, chest pain, abdominal pain, problems with bowel movements, urination, or intercourse.  Objective:  BP 120/82   Pulse 75   Ht 5\' 8"  (1.727 m)   Wt 191 lb 11.2 oz (87 kg)   LMP 11/22/2017   Breastfeeding? Yes   BMI 29.15 kg/m  Physical Exam  General:  Well developed, well nourished, no acute distress. She is alert and oriented x3. Skin:  Warm and dry Neck:  Midline trachea, no thyromegaly or nodules Cardiovascular: Regular rate and rhythm, no murmur heard Lungs:  Effort normal, all lung fields clear to auscultation bilaterally Breasts:  No dominant palpable mass, retraction, or nipple discharge Abdomen:  Soft, non tender, no hepatosplenomegaly or masses Pelvic:  External genitalia is normal in appearance.  The vagina is normal in appearance. The cervix is bulbous, no CMT.  Thin prep pap is done with HR HPV cotesting. Uterus is felt to be normal size, shape, and contour.  No adnexal masses or tenderness noted. Extremities:  No swelling or varicosities noted Psych:  She has a normal mood and affect  Assessment:   Healthy well-woman exam Bilateral ingrown great toe toenails   Plan:  Encouraged delegation of work/home responsibilities and de-stressing when possible.  Referrals placed to establish care with dermatology and podiatry. F/U 1 year for AE, or sooner if needed   Cherylyn Sundby Rockney Ghee, CNM

## 2017-12-16 NOTE — Patient Instructions (Signed)
Preventive Care 18-39 Years, Female Preventive care refers to lifestyle choices and visits with your health care provider that can promote health and wellness. What does preventive care include?  A yearly physical exam. This is also called an annual well check.  Dental exams once or twice a year.  Routine eye exams. Ask your health care provider how often you should have your eyes checked.  Personal lifestyle choices, including: ? Daily care of your teeth and gums. ? Regular physical activity. ? Eating a healthy diet. ? Avoiding tobacco and drug use. ? Limiting alcohol use. ? Practicing safe sex. ? Taking vitamin and mineral supplements as recommended by your health care provider. What happens during an annual well check? The services and screenings done by your health care provider during your annual well check will depend on your age, overall health, lifestyle risk factors, and family history of disease. Counseling Your health care provider may ask you questions about your:  Alcohol use.  Tobacco use.  Drug use.  Emotional well-being.  Home and relationship well-being.  Sexual activity.  Eating habits.  Work and work Statistician.  Method of birth control.  Menstrual cycle.  Pregnancy history.  Screening You may have the following tests or measurements:  Height, weight, and BMI.  Diabetes screening. This is done by checking your blood sugar (glucose) after you have not eaten for a while (fasting).  Blood pressure.  Lipid and cholesterol levels. These may be checked every 5 years starting at age 38.  Skin check.  Hepatitis C blood test.  Hepatitis B blood test.  Sexually transmitted disease (STD) testing.  BRCA-related cancer screening. This may be done if you have a family history of breast, ovarian, tubal, or peritoneal cancers.  Pelvic exam and Pap test. This may be done every 3 years starting at age 38. Starting at age 30, this may be done  every 5 years if you have a Pap test in combination with an HPV test.  Discuss your test results, treatment options, and if necessary, the need for more tests with your health care provider. Vaccines Your health care provider may recommend certain vaccines, such as:  Influenza vaccine. This is recommended every year.  Tetanus, diphtheria, and acellular pertussis (Tdap, Td) vaccine. You may need a Td booster every 10 years.  Varicella vaccine. You may need this if you have not been vaccinated.  HPV vaccine. If you are 39 or younger, you may need three doses over 6 months.  Measles, mumps, and rubella (MMR) vaccine. You may need at least one dose of MMR. You may also need a second dose.  Pneumococcal 13-valent conjugate (PCV13) vaccine. You may need this if you have certain conditions and were not previously vaccinated.  Pneumococcal polysaccharide (PPSV23) vaccine. You may need one or two doses if you smoke cigarettes or if you have certain conditions.  Meningococcal vaccine. One dose is recommended if you are age 68-21 years and a first-year college student living in a residence hall, or if you have one of several medical conditions. You may also need additional booster doses.  Hepatitis A vaccine. You may need this if you have certain conditions or if you travel or work in places where you may be exposed to hepatitis A.  Hepatitis B vaccine. You may need this if you have certain conditions or if you travel or work in places where you may be exposed to hepatitis B.  Haemophilus influenzae type b (Hib) vaccine. You may need this  if you have certain risk factors.  Talk to your health care provider about which screenings and vaccines you need and how often you need them. This information is not intended to replace advice given to you by your health care provider. Make sure you discuss any questions you have with your health care provider. Document Released: 06/03/2001 Document Revised:  12/26/2015 Document Reviewed: 02/06/2015 Elsevier Interactive Patient Education  2018 Elsevier Inc.  

## 2017-12-18 LAB — COMPREHENSIVE METABOLIC PANEL
A/G RATIO: 1.9 (ref 1.2–2.2)
ALK PHOS: 72 IU/L (ref 39–117)
ALT: 13 IU/L (ref 0–32)
AST: 18 IU/L (ref 0–40)
Albumin: 4.2 g/dL (ref 3.5–5.5)
BILIRUBIN TOTAL: 0.2 mg/dL (ref 0.0–1.2)
BUN/Creatinine Ratio: 15 (ref 9–23)
BUN: 11 mg/dL (ref 6–20)
CALCIUM: 8.7 mg/dL (ref 8.7–10.2)
CHLORIDE: 104 mmol/L (ref 96–106)
CO2: 21 mmol/L (ref 20–29)
Creatinine, Ser: 0.71 mg/dL (ref 0.57–1.00)
GFR calc Af Amer: 125 mL/min/{1.73_m2} (ref >59)
GFR, EST NON AFRICAN AMERICAN: 108 mL/min/{1.73_m2} (ref >59)
GLOBULIN, TOTAL: 2.2 g/dL (ref 1.5–4.5)
GLUCOSE: 87 mg/dL (ref 65–99)
POTASSIUM: 4.3 mmol/L (ref 3.5–5.2)
SODIUM: 140 mmol/L (ref 134–144)
TOTAL PROTEIN: 6.4 g/dL (ref 6.0–8.5)

## 2017-12-18 LAB — FOOD ALLERGY PROFILE
Allergen Corn, IgE: 0.1 kU/L — AB
EGG WHITE IGE: 0.19 kU/L — AB
Milk IgE: <0.1 kU/L
Peanut IgE: 0.18 kU/L — AB
Sesame Seed IgE: <0.1 kU/L
Soybean IgE: <0.1 kU/L
WHEAT IGE: 0.5 kU/L — AB

## 2017-12-18 LAB — THYROID PANEL WITH TSH
Free Thyroxine Index: 1.8 (ref 1.2–4.9)
T3 Uptake Ratio: 25 % (ref 24–39)
T4, Total: 7.1 ug/dL (ref 4.5–12.0)
TSH: 2.06 u[IU]/mL (ref 0.450–4.500)

## 2017-12-18 LAB — LIPID PANEL
CHOLESTEROL TOTAL: 149 mg/dL (ref 100–199)
Chol/HDL Ratio: 2.4 ratio (ref 0.0–4.4)
HDL: 62 mg/dL (ref >39)
LDL Calculated: 75 mg/dL (ref 0–99)
Triglycerides: 58 mg/dL (ref 0–149)
VLDL CHOLESTEROL CAL: 12 mg/dL (ref 5–40)

## 2017-12-18 LAB — GLIA (IGA/G) + TTG IGA
ANTIGLIADIN ABS, IGA: 6 U (ref 0–19)
Gliadin IgG: 2 units (ref 0–19)
Transglutaminase IgA: <2 U/mL (ref 0–3)

## 2017-12-19 ENCOUNTER — Other Ambulatory Visit: Payer: Self-pay

## 2017-12-19 ENCOUNTER — Ambulatory Visit
Admission: EM | Admit: 2017-12-19 | Discharge: 2017-12-19 | Disposition: A | Discharge status: Home / Self Care | Payer: 59 | Attending: Family Medicine | Admitting: Family Medicine

## 2017-12-19 DIAGNOSIS — N61 Mastitis without abscess: Secondary | ICD-10-CM

## 2017-12-19 MED ORDER — DICLOXACILLIN SODIUM 500 MG PO CAPS: 500.0000 mg | ORAL_CAPSULE | Freq: Two times a day (BID) | ORAL | 0 refills | Status: DC

## 2017-12-19 NOTE — ED Triage Notes (Addendum)
Pt reports "I have mastitis". Pt with breast pain bilaterally, decreased milk production, and fever. Sx started last night. Reports she has had mastitis twice before with her 61.38 month old baby

## 2017-12-19 NOTE — ED Provider Notes (Signed)
[facility] ____________________________________________  Time seen: Approximately 9:42 AM  I have reviewed the triage vital signs and the nursing notes.   HISTORY  Chief Complaint Breast Pain   HPI Angela [name] is a 37 y.o. female presenting for evaluation of bilateral breast soreness, some redness, chills and fever of 101 this morning.  States this all started last night as she was going to bed she felt chills.  States that she is 4.5 months postpartum and is currently still breast-feeding, stating she is pumping.  States that she produces large amounts of milk, and states that she had mastitis twice before with this same child as he had latching issues so now she pumps.  Feels that she may have gotten off her pattern this week leading to this.  Has not taken any over-the-counter medications for the same complaints.  States currently just feels tired.  States felt some fluid in her ear earlier this week but no ear pain.  Denies any other changes of symptoms.  No accompanying cough, congestion, sore throat, dysuria, abdominal pain.  Reports otherwise feels well.  No other recent antibiotic use.  Denies current pregnancy.    Past Medical History:  Diagnosis Date  . Abnormal Pap smear of cervix   . Anxiety   . Asthma   . Complication of anesthesia    blood pressure drop  . Depression   . Environmental and seasonal allergies   . GERD (gastroesophageal reflux disease)   . Herpes genitalis   . Vitamin D deficiency     There are no active problems to display for this patient.   Past Surgical History:  Procedure Laterality Date  . APPENDECTOMY    . BREAST REDUCTION SURGERY    . LAPAROSCOPIC TUBAL LIGATION Bilateral 09/28/2017   Procedure: LAPAROSCOPIC BILATERAL TUBAL LIGATION;  Surgeon: Rubie Maid, MD;  Location: [facility] ORS;  Service: Gynecology;  Laterality: Bilateral;  . MOUTH SURGERY    . skin cyst    . TONSILLECTOMY       No current  facility-administered medications for this encounter.   Current Outpatient Medications:  .  acetaminophen (TYLENOL) 325 MG tablet, Take 650 mg by mouth every 6 (six) hours as needed for headache., Disp: , Rfl:  .  albuterol (PROVENTIL HFA;VENTOLIN HFA) 108 (90 Base) MCG/ACT inhaler, Inhale 2 puffs into the lungs every 4 (four) hours as needed for wheezing or shortness of breath., Disp: 1 Inhaler, Rfl: 0 .  azelastine (ASTELIN) 0.1 % nasal spray, Place 1 spray into both nostrils daily., Disp: 30 mL, Rfl: 2 .  cetirizine (ZYRTEC) 10 MG tablet, Take 10 mg by mouth daily., Disp: , Rfl:  .  dicloxacillin (DYNAPEN) 500 MG capsule, Take 1 capsule (500 mg total) by mouth 2 (two) times daily., Disp: 20 capsule, Rfl: 0 .  fluticasone (FLONASE) 50 MCG/ACT nasal spray, Place 1 spray into both nostrils daily. , Disp: , Rfl:  .  ibuprofen (ADVIL,MOTRIN) 600 MG tablet, Take 1 tablet (600 mg total) by mouth every 6 (six) hours. (Patient not taking: Reported on 12/16/2017), Disp: 30 tablet, Rfl: 0 .  ketotifen (ZADITOR) 0.025 % ophthalmic solution, Place 1 drop into both eyes daily as needed (allergies)., Disp: , Rfl:  .  Lecithin 1200 MG CAPS, Take 1,200 mg by mouth 2 (two) times daily. , Disp: , Rfl:  .  Prenatal Vit-Fe Fumarate-FA (PRENATAL MULTIVITAMIN) TABS tablet, Take 1 tablet by mouth daily at 12 noon., Disp: , Rfl:  .  ranitidine (ZANTAC) 150  MG tablet, Take 1 tablet (150 mg total) by mouth 2 (two) times daily., Disp: 60 tablet, Rfl: 1 .  valACYclovir (VALTREX) 500 MG tablet, Take 1 tablet (500 mg total) by mouth 2 (two) times daily. (Patient not taking: Reported on 09/16/2017), Disp: 60 tablet, Rfl: 2  Allergies Patient has no known allergies.  Family History  Adopted: Yes    Social History Social History   Tobacco Use  . Smoking status: Never Smoker  . Smokeless tobacco: Never Used  Substance Use Topics  . Alcohol use: Yes    Comment: occas  . Drug use: No    Review of  Systems Constitutional: AS above.  Cardiovascular: Denies chest pain. Respiratory: Denies shortness of breath. Gastrointestinal: No abdominal pain.   Musculoskeletal: Negative for back pain. Skin:as above.   ____________________________________________   PHYSICAL EXAM:  VITAL SIGNS: ED Triage Vitals  Enc Vitals Group     BP 12/19/17 0814 106/71     Pulse Rate 12/19/17 0814 (!) 105     Resp 12/19/17 0814 16     Temp 12/19/17 0814 99.2 F (37.3 C)     Temp Source 12/19/17 0814 Oral     SpO2 12/19/17 0814 99 %     Weight 12/19/17 0815 191 lb (86.6 kg)     Height 12/19/17 0815 5\' 8"  (1.727 m)     Head Circumference --      Peak Flow --      Pain Score 12/19/17 0815 4     Pain Loc --      Pain Edu? --      Excl. in [place]? --     Constitutional: Alert and oriented. Well appearing and in no acute distress. Eyes: Conjunctivae are normal.  Head: Atraumatic. No sinus tenderness to palpation. No swelling. No erythema.  Ears: no erythema, normal TMs bilaterally.   Nose:No nasal congestion   Mouth/Throat: Mucous membranes are moist.  Cardiovascular: Normal rate, regular rhythm. Grossly normal heart sounds.  Good peripheral circulation. Respiratory: Normal respiratory effort.  No retractions. No wheezes, rales or rhonchi. Good air movement.  Musculoskeletal: Ambulatory with steady gait.  Neurologic:  Normal speech and language. No gait instability. Skin:  Skin appears warm, dry.  Except: Left breast at approximately 11:00 mild erythema and mild tenderness, left breast otherwise nontender, no atypical mass palpated.  Right breast from 4-6 o'clock mild erythema, mild firmness and tenderness, right breast otherwise nontender.  Psychiatric: Mood and affect are normal. Speech and behavior are normal.  ___________________________________________   LABS (all labs ordered are listed, but only abnormal results are displayed)  Labs Reviewed - No data to display  PROCEDURES Procedures     INITIAL IMPRESSION / ASSESSMENT AND PLAN / ED COURSE  Pertinent labs & imaging results that were available during my care of the patient were reviewed by me and considered in my medical decision making (see chart for details).  Overall well-appearing patient.  No acute distress.  Exam and subjective report consistent with mastitis, right breast predominately.  Encouraged to continue emptying breast, supportive care and monitoring for resolution of pain and firmness.  Will treat with dicloxacillin.  Over-the-counter Tylenol and ibuprofen.  Follow-up as needed.Discussed indication, risks and benefits of medications with patient and breastfeeding considerations.   Discussed follow up with Primary care physician this week. Discussed follow up and return parameters including no resolution or any worsening concerns. Patient verbalized understanding and agreed to plan.   ____________________________________________   FINAL CLINICAL IMPRESSION(S) / ED  DIAGNOSES  Final diagnoses:  Mastitis     ED Discharge Orders         Ordered    dicloxacillin (DYNAPEN) 500 MG capsule  2 times daily     12/19/17 0844           Note: This dictation was prepared with Dragon dictation along with smaller phrase technology. Any transcriptional errors that result from this process are unintentional.         Marylene Land, NP 12/19/17 1047

## 2017-12-19 NOTE — Discharge Instructions (Addendum)
Take medication as prescribed. Rest. Drink plenty of fluids. Empty breasts completely as discussed.   Follow up with your primary care physician or OBGYN this week as needed. Return to Urgent care for new or worsening concerns.

## 2017-12-22 LAB — CYTOLOGY - PAP
DIAGNOSIS: NEGATIVE
HPV (WINDOPATH): NOT DETECTED

## 2017-12-23 ENCOUNTER — Other Ambulatory Visit: Payer: Self-pay | Admitting: Obstetrics and Gynecology

## 2017-12-23 MED ORDER — METOCLOPRAMIDE HCL 10 MG PO TABS: 10.0000 mg | ORAL_TABLET | Freq: Two times a day (BID) | ORAL | 1 refills | Status: DC

## 2018-01-13 ENCOUNTER — Other Ambulatory Visit: Payer: Self-pay | Admitting: Obstetrics and Gynecology

## 2018-01-29 ENCOUNTER — Encounter: Payer: Self-pay | Admitting: Podiatry

## 2018-01-29 ENCOUNTER — Ambulatory Visit (INDEPENDENT_AMBULATORY_CARE_PROVIDER_SITE_OTHER): Payer: 59 | Admitting: Podiatry

## 2018-01-29 DIAGNOSIS — L600 Ingrowing nail: Secondary | ICD-10-CM | POA: Diagnosis not present

## 2018-01-29 MED ORDER — GENTAMICIN SULFATE 0.1 % EX CREA: 1.0000 "application " | TOPICAL_CREAM | Freq: Three times a day (TID) | CUTANEOUS | 1 refills | Status: DC

## 2018-02-02 NOTE — Progress Notes (Signed)
   Subjective: Patient presents today for evaluation of intermittent pain to the medial and lateral borders of the bilateral great toes that has been ongoing for the past few years. Patient is concerned for possible ingrown nail. Wearing shoes increases the pain. She has been applying peroxide to clean the areas for treatment. Patient presents today for further treatment and evaluation.  Past Medical History:  Diagnosis Date  . Abnormal Pap smear of cervix   . Anxiety   . Asthma   . Complication of anesthesia    blood pressure drop  . Depression   . Environmental and seasonal allergies   . GERD (gastroesophageal reflux disease)   . Herpes genitalis   . Vitamin D deficiency     Objective:  General: Well developed, nourished, in no acute distress, alert and oriented x3   Dermatology: Skin is warm, dry and supple bilateral. Medial and lateral borders of bilateral great toes appears to be erythematous with evidence of an ingrowing nail. Pain on palpation noted to the border of the nail fold. The remaining nails appear unremarkable at this time. There are no open sores, lesions.  Vascular: Dorsalis Pedis artery and Posterior Tibial artery pedal pulses palpable. No lower extremity edema noted.   Neruologic: Grossly intact via light touch bilateral.  Musculoskeletal: Muscular strength within normal limits in all groups bilateral. Normal range of motion noted to all pedal and ankle joints.   Assesement: #1 Paronychia with ingrowing nail medial and lateral borders bilateral great toes #2 Pain in toe #3 Incurvated nail  Plan of Care:  1. Patient evaluated.  2. Discussed treatment alternatives and plan of care. Explained nail avulsion procedure and post procedure course to patient. 3. Patient opted for permanent partial nail avulsion.  4. Prior to procedure, local anesthesia infiltration utilized using 3 ml of a 50:50 mixture of 2% plain lidocaine and 0.5% plain marcaine in a normal hallux  block fashion and a betadine prep performed.  5. Partial permanent nail avulsion with chemical matrixectomy performed using [id number] applications of phenol followed by alcohol flush.  6. Light dressing applied. 7. Prescription for gentamicin cream provided to patient.  8. Return to clinic in 2 weeks.   Edrick Kins, DPM [facility]  Dr. Edrick Kins, [address]                                        [place],  [address]                Office [phone]  Fax [phone]

## 2018-02-03 DIAGNOSIS — Z23 Encounter for immunization: Secondary | ICD-10-CM | POA: Diagnosis not present

## 2018-02-05 ENCOUNTER — Encounter: Payer: Self-pay | Admitting: Podiatry

## 2018-02-05 ENCOUNTER — Ambulatory Visit (INDEPENDENT_AMBULATORY_CARE_PROVIDER_SITE_OTHER): Payer: 59 | Admitting: Podiatry

## 2018-02-05 DIAGNOSIS — Z9889 Other specified postprocedural states: Secondary | ICD-10-CM

## 2018-02-05 DIAGNOSIS — L600 Ingrowing nail: Secondary | ICD-10-CM

## 2018-02-05 NOTE — Progress Notes (Signed)
   Subjective: Patient presents today 2 weeks post ingrown nail permanent nail avulsion procedure of the medial and lateral borders of the bilateral great toes. Patient states that the toe and nail fold is feeling much better. Patient is here for further evaluation and treatment.   Past Medical History:  Diagnosis Date  . Abnormal Pap smear of cervix   . Anxiety   . Asthma   . Complication of anesthesia    blood pressure drop  . Depression   . Environmental and seasonal allergies   . GERD (gastroesophageal reflux disease)   . Herpes genitalis   . Vitamin D deficiency     Objective: Skin is warm, dry and supple. Nail and respective nail fold appears to be healing appropriately. Open wound to the associated nail fold with a granular wound base and moderate amount of fibrotic tissue. Minimal drainage noted. Mild erythema around the periungual region likely due to phenol chemical matricectomy.  Assessment: #1 postop permanent partial nail avulsion medial and lateral borders of bilateral great toes #2 open wound periungual nail fold of respective digit.   Plan of care: #1 patient was evaluated  #2 debridement of open wound was performed to the periungual border of the respective toe using a currette. Antibiotic ointment and Band-Aid was applied. #3 patient is to return to clinic on a PRN basis.   Edrick Kins, DPM [facility]  Dr. Edrick Kins, [address]                                        [place], [place] [address]                Office [phone]  Fax [phone]

## 2018-02-19 ENCOUNTER — Other Ambulatory Visit: Payer: Self-pay | Admitting: Obstetrics and Gynecology

## 2018-03-29 ENCOUNTER — Ambulatory Visit
Admission: EM | Admit: 2018-03-29 | Discharge: 2018-03-29 | Disposition: A | Discharge status: Home / Self Care | Payer: 59 | Attending: Family Medicine | Admitting: Family Medicine

## 2018-03-29 ENCOUNTER — Encounter: Payer: Self-pay | Admitting: Emergency Medicine

## 2018-03-29 ENCOUNTER — Other Ambulatory Visit: Payer: Self-pay

## 2018-03-29 DIAGNOSIS — J988 Other specified respiratory disorders: Secondary | ICD-10-CM

## 2018-03-29 DIAGNOSIS — B9789 Other viral agents as the cause of diseases classified elsewhere: Secondary | ICD-10-CM

## 2018-03-29 MED ORDER — DEXAMETHASONE SODIUM PHOSPHATE 10 MG/ML IJ SOLN
10.0000 mg | Freq: Once | INTRAMUSCULAR | Status: AC
  Administered 2018-03-29: 10 mg via INTRAMUSCULAR

## 2018-03-29 MED ORDER — BENZONATATE 100 MG PO CAPS: 100.0000 mg | ORAL_CAPSULE | Freq: Three times a day (TID) | ORAL | 0 refills | Status: DC | PRN

## 2018-03-29 NOTE — ED Triage Notes (Signed)
Patient in today c/o cough and laryngitis x 1-2 days. Patient denies fever. Patient states she has bronchitis and is in for a Decadron injection. Patient has been using her inhaler and nebulizer and Sudafed without relief.

## 2018-03-29 NOTE — Discharge Instructions (Signed)
Rest. Fluids.  Decadron should help.  Cough medication as needed.  Take care  Dr. Lacinda Axon

## 2018-03-29 NOTE — ED Provider Notes (Signed)
[facility]    CSN: [id number] Arrival date & time: 03/29/18  0816  History   Chief Complaint Chief Complaint  Patient presents with  . Cough    APPT  . Laryngitis    HPI   37 year old female presents with the above complaints.  Patient reports that she developed hoarseness on Saturday.  Developed cough yesterday.  Dry cough.  No fever.  She has a history of asthma.  Patient states that she leaves that she has bronchitis.  She has used albuterol and Sudafed without relief.  Patient is adamant that she has bronchitis and needs a steroid injection.  Reports shortness of breath with activity.  No relieving factors.  No other associated symptoms.  No other complaints.   PMH, Surgical Hx, Family Hx, Social History reviewed and updated as below.  Past Medical History:  Diagnosis Date  . Abnormal Pap smear of cervix   . Anxiety   . Asthma   . Complication of anesthesia    blood pressure drop  . Depression   . Environmental and seasonal allergies   . GERD (gastroesophageal reflux disease)   . Herpes genitalis   . Vitamin D deficiency    Past Surgical History:  Procedure Laterality Date  . APPENDECTOMY    . BREAST REDUCTION SURGERY    . LAPAROSCOPIC TUBAL LIGATION Bilateral 09/28/2017   Procedure: LAPAROSCOPIC BILATERAL TUBAL LIGATION;  Surgeon: Rubie Maid, MD;  Location: [facility] ORS;  Service: Gynecology;  Laterality: Bilateral;  . MOUTH SURGERY    . skin cyst    . TONSILLECTOMY      OB History    Gravida  2   Para  2   Term  2   Preterm      AB      Living  2     SAB      TAB      Ectopic      Multiple  0   Live Births  2            Home Medications    Prior to Admission medications   Medication Sig Start Date End Date Taking? Authorizing Provider  albuterol (PROVENTIL HFA;VENTOLIN HFA) 108 (90 Base) MCG/ACT inhaler Inhale 2 puffs into the lungs every 4 (four) hours as needed for wheezing or shortness of breath. 11/23/16  Yes Marylene Land, NP  azelastine (ASTELIN) 0.1 % nasal spray PLACE 1 SPRAY INTO BOTH NOSTRILS DAILY. 01/13/18  Yes Shambley, Melody N, CNM  fluticasone (FLONASE) 50 MCG/ACT nasal spray Place 1 spray into both nostrils daily.    Yes [provider]  ibuprofen (ADVIL,MOTRIN) 600 MG tablet Take 1 tablet (600 mg total) by mouth every 6 (six) hours. 08/04/17  Yes Philip Aspen, CNM  ketotifen (ZADITOR) 0.025 % ophthalmic solution Place 1 drop into both eyes daily as needed (allergies).   Yes [provider]  Multiple Vitamin (MULTIVITAMIN) tablet Take 1 tablet by mouth daily.   Yes [provider]  valACYclovir (VALTREX) 500 MG tablet Take 1 tablet (500 mg total) by mouth 2 (two) times daily. 07/15/17  Yes Shambley, Melody N, CNM  benzonatate (TESSALON) 100 MG capsule Take 1 capsule (100 mg total) by mouth 3 (three) times daily as needed. 03/29/18   Coral Spikes, DO    Family History Family History  Adopted: Yes  Family history unknown: Yes    Social History Social History   Tobacco Use  . Smoking status: Never Smoker  . Smokeless  tobacco: Never Used  Substance Use Topics  . Alcohol use: Yes    Comment: occas  . Drug use: No     Allergies   Patient has no known allergies.   Review of Systems Review of Systems  HENT: Positive for voice change.   Respiratory: Positive for cough and shortness of breath.    Physical Exam Triage Vital Signs ED Triage Vitals  Enc Vitals Group     BP 03/29/18 0824 119/78     Pulse Rate 03/29/18 0824 86     Resp 03/29/18 0824 16     Temp 03/29/18 0824 98.2 F (36.8 C)     Temp Source 03/29/18 0824 Oral     SpO2 03/29/18 0824 100 %     Weight 03/29/18 0825 171 lb (77.6 kg)     Height 03/29/18 0825 5\' 8"  (1.727 m)     Head Circumference --      Peak Flow --      Pain Score 03/29/18 0824 2     Pain Loc --      Pain Edu? --      Excl. in [place]? --    Updated Vital Signs BP 119/78 (BP Location: Left Arm)   Pulse 86   Temp 98.2  F (36.8 C) (Oral)   Resp 16   Ht 5\' 8"  (1.727 m)   Wt 77.6 kg   LMP 03/16/2018 (Exact Date)   SpO2 100%   BMI 26.00 kg/m   Visual Acuity Right Eye Distance:   Left Eye Distance:   Bilateral Distance:    Right Eye Near:   Left Eye Near:    Bilateral Near:     Physical Exam  Constitutional: She is oriented to person, place, and time. She appears well-developed. No distress.  HENT:  Head: Normocephalic and atraumatic.  Mouth/Throat: Oropharynx is clear and moist.  Eyes: Conjunctivae are normal. Right eye exhibits no discharge. Left eye exhibits no discharge.  Cardiovascular: Normal rate and regular rhythm.  Pulmonary/Chest: Effort normal and breath sounds normal. She has no wheezes. She has no rales.  Neurological: She is alert and oriented to person, place, and time.  Psychiatric: She has a normal mood and affect. Her behavior is normal.  Nursing note and vitals reviewed.  UC Treatments / Results  Labs (all labs ordered are listed, but only abnormal results are displayed) Labs Reviewed - No data to display  EKG None  Radiology No results found.  Procedures Procedures (including critical care time)  Medications Ordered in UC Medications  dexamethasone (DECADRON) injection 10 mg (10 mg Intramuscular Given 03/29/18 0849)    Initial Impression / Assessment and Plan / UC Course  I have reviewed the triage vital signs and the nursing notes.  Pertinent labs & imaging results that were available during my care of the patient were reviewed by me and considered in my medical decision making (see chart for details).    37 year old female presents with a viral respiratory infection.  There is no evidence that she has bronchitis given the fact that her symptoms started on Saturday.  Patient requested Decadron injection.  Medication given.  Tessalon Perles for cough.  Supportive care.  Final Clinical Impressions(s) / UC Diagnoses   Final diagnoses:  Viral respiratory  infection     Discharge Instructions     Rest. Fluids.  Decadron should help.  Cough medication as needed.  Take care  Dr. Lacinda Axon    ED Prescriptions  Medication Sig Dispense Auth. Provider   benzonatate (TESSALON) 100 MG capsule Take 1 capsule (100 mg total) by mouth 3 (three) times daily as needed. 30 capsule Coral Spikes, DO     Controlled Substance Prescriptions [place] Controlled Substance Registry consulted? Not Applicable   Coral Spikes, DO 03/29/18 0901

## 2018-04-06 ENCOUNTER — Other Ambulatory Visit: Payer: Self-pay | Admitting: Obstetrics and Gynecology

## 2018-04-06 DIAGNOSIS — N3946 Mixed incontinence: Secondary | ICD-10-CM

## 2018-06-02 ENCOUNTER — Other Ambulatory Visit: Payer: Self-pay

## 2018-06-02 ENCOUNTER — Ambulatory Visit
Admission: EM | Admit: 2018-06-02 | Discharge: 2018-06-02 | Disposition: A | Discharge status: Home / Self Care | Payer: BLUE CROSS/BLUE SHIELD | Attending: Family Medicine | Admitting: Family Medicine

## 2018-06-02 DIAGNOSIS — J111 Influenza due to unidentified influenza virus with other respiratory manifestations: Secondary | ICD-10-CM

## 2018-06-02 DIAGNOSIS — J101 Influenza due to other identified influenza virus with other respiratory manifestations: Secondary | ICD-10-CM

## 2018-06-02 LAB — RAPID INFLUENZA A&B ANTIGENS
Influenza A (ARMC): POSITIVE — AB
Influenza B (ARMC): NEGATIVE

## 2018-06-02 MED ORDER — OSELTAMIVIR PHOSPHATE 75 MG PO CAPS: 75.0000 mg | ORAL_CAPSULE | Freq: Two times a day (BID) | ORAL | 0 refills | Status: DC

## 2018-06-02 NOTE — ED Triage Notes (Signed)
Pt reports cough x 2 days. Today temp was 103.2 before Motrin.

## 2018-06-02 NOTE — ED Provider Notes (Signed)
[facility]    CSN: [id number] Arrival date & time: 06/02/18  1856  History   Chief Complaint Chief Complaint  Patient presents with  . Fever  . Appointment   HPI  38 year old female presents with cough, congestion and fever.  Cough started yesterday. Fever began early this am (Tmax 103). Now has congestion. She has taken motrin and sudafed with improvement. Moderate in severity. Denies body aches. No known exacerbating factors. No other reported symptoms. No other complaints.   PMH, Surgical Hx, Family Hx, Social History reviewed and updated as below.  Past Medical History:  Diagnosis Date  . Abnormal Pap smear of cervix   . Anxiety   . Asthma   . Complication of anesthesia    blood pressure drop  . Depression   . Environmental and seasonal allergies   . GERD (gastroesophageal reflux disease)   . Herpes genitalis   . Vitamin D deficiency    Past Surgical History:  Procedure Laterality Date  . APPENDECTOMY    . BREAST REDUCTION SURGERY    . LAPAROSCOPIC TUBAL LIGATION Bilateral 09/28/2017   Procedure: LAPAROSCOPIC BILATERAL TUBAL LIGATION;  Surgeon: Rubie Maid, MD;  Location: [facility] ORS;  Service: Gynecology;  Laterality: Bilateral;  . MOUTH SURGERY    . skin cyst    . TONSILLECTOMY     OB History    Gravida  2   Para  2   Term  2   Preterm      AB      Living  2     SAB      TAB      Ectopic      Multiple  0   Live Births  2          Home Medications    Prior to Admission medications   Medication Sig Start Date End Date Taking? Authorizing Provider  albuterol (PROVENTIL HFA;VENTOLIN HFA) 108 (90 Base) MCG/ACT inhaler Inhale 2 puffs into the lungs every 4 (four) hours as needed for wheezing or shortness of breath. 11/23/16   Marylene Land, NP  azelastine (ASTELIN) 0.1 % nasal spray PLACE 1 SPRAY INTO BOTH NOSTRILS DAILY. 01/13/18   Shambley, Melody N, CNM  fluticasone (FLONASE) 50 MCG/ACT nasal spray Place 1 spray into both  nostrils daily.     [provider]  ibuprofen (ADVIL,MOTRIN) 600 MG tablet Take 1 tablet (600 mg total) by mouth every 6 (six) hours. 08/04/17   Philip Aspen, CNM  ketotifen (ZADITOR) 0.025 % ophthalmic solution Place 1 drop into both eyes daily as needed (allergies).    [provider]  Multiple Vitamin (MULTIVITAMIN) tablet Take 1 tablet by mouth daily.    [provider]  oseltamivir (TAMIFLU) 75 MG capsule Take 1 capsule (75 mg total) by mouth every 12 (twelve) hours. 06/02/18   Coral Spikes, DO  valACYclovir (VALTREX) 500 MG tablet Take 1 tablet (500 mg total) by mouth 2 (two) times daily. 07/15/17   Joylene Igo, CNM   Family History Family History  Adopted: Yes  Family history unknown: Yes   Social History Social History   Tobacco Use  . Smoking status: Never Smoker  . Smokeless tobacco: Never Used  Substance Use Topics  . Alcohol use: Yes    Comment: occas  . Drug use: No   Allergies   Patient has no known allergies.  Review of Systems Review of Systems  Constitutional: Positive for fever.  HENT: Positive for congestion.  Respiratory: Positive for cough.    Physical Exam Triage Vital Signs ED Triage Vitals [06/02/18 0943]  Enc Vitals Group     BP 104/70     Pulse Rate (!) 110     Resp 18     Temp 99 F (37.2 C)     Temp Source Oral     SpO2 100 %     Weight 174 lb (78.9 kg)     Height 5\' 8"  (1.727 m)     Head Circumference      Peak Flow      Pain Score 0     Pain Loc      Pain Edu?      Excl. in [place]?    Updated Vital Signs BP 104/70 (BP Location: Right Arm)   Pulse (!) 110   Temp 99 F (37.2 C) (Oral)   Resp 18   Ht 5\' 8"  (1.727 m)   Wt 78.9 kg   LMP 05/03/2018   SpO2 100%   BMI 26.46 kg/m   Visual Acuity Right Eye Distance:   Left Eye Distance:   Bilateral Distance:    Right Eye Near:   Left Eye Near:    Bilateral Near:     Physical Exam Vitals signs and nursing note reviewed.  Constitutional:       Appearance: Normal appearance.  HENT:     Head: Normocephalic and atraumatic.     Right Ear: Tympanic membrane normal.     Left Ear: Tympanic membrane normal.     Nose: Nose normal.     Mouth/Throat:     Pharynx: Oropharynx is clear. No posterior oropharyngeal erythema.  Eyes:     General:        Right eye: No discharge.        Left eye: No discharge.     Conjunctiva/sclera: Conjunctivae normal.  Cardiovascular:     Rate and Rhythm: Regular rhythm. Tachycardia present.  Pulmonary:     Effort: Pulmonary effort is normal.     Breath sounds: Normal breath sounds.  Neurological:     Mental Status: She is alert.  Psychiatric:        Mood and Affect: Mood normal.        Behavior: Behavior normal.    UC Treatments / Results  Labs (all labs ordered are listed, but only abnormal results are displayed) Labs Reviewed  RAPID INFLUENZA A&B ANTIGENS ([facility] ONLY) - Abnormal; Notable for the following components:      Result Value   Influenza A ([facility]) POSITIVE (*)    All other components within normal limits    EKG None  Radiology No results found.  Procedures Procedures (including critical care time)  Medications Ordered in UC Medications - No data to display  Initial Impression / Assessment and Plan / UC Course  I have reviewed the triage vital signs and the nursing notes.  Pertinent labs & imaging results that were available during my care of the patient were reviewed by me and considered in my medical decision making (see chart for details).    38 year old female presents with influenza.  Treating with Tamiflu.  Supportive care.  Final Clinical Impressions(s) / UC Diagnoses   Final diagnoses:  Influenza   Discharge Instructions   None    ED Prescriptions    Medication Sig Dispense Auth. Provider   oseltamivir (TAMIFLU) 75 MG capsule Take 1 capsule (75 mg total) by mouth every 12 (twelve) hours. 10 capsule Coral Spikes,  DO     Controlled Substance  Prescriptions [place] Controlled Substance Registry consulted? Not Applicable   Coral Spikes, DO 06/02/18 1036

## 2018-07-13 ENCOUNTER — Other Ambulatory Visit: Payer: Self-pay | Admitting: *Deleted

## 2018-07-13 MED ORDER — MOXIFLOXACIN HCL 0.5 % OP SOLN: 1.0000 [drp] | Freq: Three times a day (TID) | OPHTHALMIC | Status: DC

## 2018-07-13 MED ORDER — MOXIFLOXACIN HCL 0.5 % OP SOLN: 1.0000 [drp] | Freq: Three times a day (TID) | OPHTHALMIC | 1 refills | Status: DC

## 2018-09-21 ENCOUNTER — Telehealth: Payer: Self-pay | Admitting: *Deleted

## 2018-09-21 NOTE — Telephone Encounter (Signed)
Coronavirus (COVID-19) Are you at risk?  Are you at risk for the Coronavirus (COVID-19)?  To be considered HIGH RISK for Coronavirus (COVID-19), you have to meet the following criteria:  . Traveled to [place], [place], [place], [place] or [place]; or in the [place] to [place], [place], [place], or [place]; and have fever, cough, and shortness of breath within the last 2 weeks of travel OR . Been in close contact with a person diagnosed with COVID-19 within the last 2 weeks and have fever, cough, and shortness of breath . IF YOU DO NOT MEET THESE CRITERIA, YOU ARE CONSIDERED LOW RISK FOR COVID-19.  What to do if you are HIGH RISK for COVID-19?  . If you are having a medical emergency, call 911. . Seek medical care right away. Before you go to a doctor's office, urgent care or emergency department, call ahead and tell them about your recent travel, contact with someone diagnosed with COVID-19, and your symptoms. You should receive instructions from your physician's office regarding next steps of care.  . When you arrive at healthcare provider, tell the healthcare staff immediately you have returned from visiting [place], [place], [place], [place] or [place]; or traveled in the [place] to [place], [place], [place], or [place]; in the last two weeks or you have been in close contact with a person diagnosed with COVID-19 in the last 2 weeks.   . Tell the health care staff about your symptoms: fever, cough and shortness of breath. . After you have been seen by a medical provider, you will be either: o Tested for (COVID-19) and discharged home on quarantine except to seek medical care if symptoms worsen, and asked to  - Stay home and avoid contact with others until you get your results (4-5 days)  - Avoid travel on public transportation if possible (such as bus, train, or airplane) or o Sent to the Emergency Department by EMS for evaluation, COVID-19 testing, and possible  admission depending on your condition and test results.  What to do if you are LOW RISK for COVID-19?  Reduce your risk of any infection by using the same precautions used for avoiding the common cold or flu:  . Wash your hands often with soap and warm water for at least 20 seconds.  If soap and water are not readily available, use an alcohol-based hand sanitizer with at least 60% alcohol.  . If coughing or sneezing, cover your mouth and nose by coughing or sneezing into the elbow areas of your shirt or coat, into a tissue or into your sleeve (not your hands). . Avoid shaking hands with others and consider head nods or verbal greetings only. . Avoid touching your eyes, nose, or mouth with unwashed hands.  . Avoid close contact with people who are sick. . Avoid places or events with large numbers of people in one location, like concerts or sporting events. . Carefully consider travel plans you have or are making. . If you are planning any travel outside or inside the [place], visit the CDC's Travelers' Health webpage for the latest health notices. . If you have some symptoms but not all symptoms, continue to monitor at home and seek medical attention if your symptoms worsen. . If you are having a medical emergency, call 911.   ADDITIONAL HEALTHCARE OPTIONS FOR PATIENTS  [facility] / e-Visit: [email or web address]         [facility]: [phone]  [facility]: [phone]                   [facility]: [phone]   Spoke with pt denies any sx.  Amy Clontz, CMA 

## 2018-09-22 ENCOUNTER — Ambulatory Visit (INDEPENDENT_AMBULATORY_CARE_PROVIDER_SITE_OTHER): Payer: BC Managed Care – PPO | Admitting: Obstetrics and Gynecology

## 2018-09-22 ENCOUNTER — Other Ambulatory Visit: Payer: Self-pay

## 2018-09-22 ENCOUNTER — Encounter: Payer: Self-pay | Admitting: Obstetrics and Gynecology

## 2018-09-22 VITALS — BP 130/89 | HR 78 | Ht 68.0 in | Wt 181.5 lb

## 2018-09-22 DIAGNOSIS — N6310 Unspecified lump in the right breast, unspecified quadrant: Secondary | ICD-10-CM | POA: Diagnosis not present

## 2018-09-22 NOTE — Progress Notes (Signed)
  Subjective:     Patient ID: Angela [name], female   DOB: Aug 11, 1980, 38 y.o.   MRN: [id number]  HPI Here with c/o breast lump in right breast that is tender. Since February  Stopped nursing in November due to mastitis. Did warm compresses and massage with no improvement. Does have history of breast augmentation.  Review of Systems  All other systems reviewed and are negative.      Objective:   Physical Exam A&Ox4 Well groomed female in nodistress Blood pressure 130/89, pulse 78, height 5\' 8"  (1.727 m), weight 181 lb 8 oz (82.3 kg), last menstrual period 09/15/2018,  Breasts: left breast normal without mass, skin or nipple changes or axillary nodes, abnormal mass palpable noted in upper outer breast about 17mm from nipple, mobile and tender without skin changes, surgical scars noted bilaterally from reduction- not new.    Assessment:     Right breast mass and tenderness    Plan:     Diagnostic MMG and breast ultrasound ordered- will follow up accordingly Recommended reduction in caffeine intake Add 800 IU Vit E daily for 7-10 days as needed.   Melody Shambley,CNM

## 2018-09-22 NOTE — Patient Instructions (Signed)
Vit E 800 IU daily for 7-10 days Reduce caffeine intake

## 2018-09-27 ENCOUNTER — Other Ambulatory Visit: Payer: Self-pay | Admitting: Obstetrics and Gynecology

## 2018-09-27 DIAGNOSIS — N6310 Unspecified lump in the right breast, unspecified quadrant: Secondary | ICD-10-CM

## 2018-10-01 ENCOUNTER — Ambulatory Visit
Admission: RE | Admit: 2018-10-01 | Discharge: 2018-10-01 | Disposition: A | Discharge status: Home / Self Care | Payer: BC Managed Care – PPO | Source: Ambulatory Visit | Attending: Obstetrics and Gynecology | Admitting: Obstetrics and Gynecology

## 2018-10-01 ENCOUNTER — Other Ambulatory Visit: Payer: Self-pay

## 2018-10-01 DIAGNOSIS — N6310 Unspecified lump in the right breast, unspecified quadrant: Secondary | ICD-10-CM | POA: Diagnosis present

## 2018-10-04 ENCOUNTER — Other Ambulatory Visit: Payer: Self-pay | Admitting: Obstetrics and Gynecology

## 2018-10-04 DIAGNOSIS — R928 Other abnormal and inconclusive findings on diagnostic imaging of breast: Secondary | ICD-10-CM

## 2018-10-04 DIAGNOSIS — N6310 Unspecified lump in the right breast, unspecified quadrant: Secondary | ICD-10-CM

## 2018-10-08 ENCOUNTER — Ambulatory Visit
Admission: RE | Admit: 2018-10-08 | Discharge: 2018-10-08 | Disposition: A | Discharge status: Home / Self Care | Payer: BC Managed Care – PPO | Source: Ambulatory Visit | Attending: Obstetrics and Gynecology | Admitting: Obstetrics and Gynecology

## 2018-10-08 ENCOUNTER — Other Ambulatory Visit: Payer: Self-pay

## 2018-10-08 DIAGNOSIS — N6310 Unspecified lump in the right breast, unspecified quadrant: Secondary | ICD-10-CM

## 2018-10-08 DIAGNOSIS — R928 Other abnormal and inconclusive findings on diagnostic imaging of breast: Secondary | ICD-10-CM | POA: Insufficient documentation

## 2018-10-08 HISTORY — PX: BREAST BIOPSY: SHX20

## 2018-10-11 LAB — SURGICAL PATHOLOGY

## 2018-10-14 ENCOUNTER — Telehealth: Payer: Self-pay | Admitting: Hematology

## 2018-10-14 ENCOUNTER — Other Ambulatory Visit: Payer: BC Managed Care – PPO

## 2018-10-14 DIAGNOSIS — Z20822 Contact with and (suspected) exposure to covid-19: Secondary | ICD-10-CM

## 2018-10-14 NOTE — Telephone Encounter (Signed)
Referred by health department / COVID orders placed and patient scheduled

## 2018-10-18 LAB — NOVEL CORONAVIRUS, NAA: SARS-CoV-2, NAA: NOT DETECTED

## 2019-03-11 ENCOUNTER — Telehealth: Payer: Self-pay

## 2019-03-11 NOTE — Telephone Encounter (Signed)
Pt states she needs to have a 6 month repeat Left breast mammogram at [facility] in December.  Please order so she can schedule per her request.

## 2019-03-14 ENCOUNTER — Other Ambulatory Visit: Payer: Self-pay

## 2019-03-14 DIAGNOSIS — D241 Benign neoplasm of right breast: Secondary | ICD-10-CM

## 2019-03-14 NOTE — Telephone Encounter (Signed)
Called and spoke with patient.  Orders placed and patient verbalized understanding.

## 2019-04-08 ENCOUNTER — Ambulatory Visit
Admission: RE | Admit: 2019-04-08 | Discharge: 2019-04-08 | Disposition: A | Discharge status: Home / Self Care | Payer: BC Managed Care – PPO | Source: Ambulatory Visit | Attending: Obstetrics and Gynecology | Admitting: Obstetrics and Gynecology

## 2019-04-08 ENCOUNTER — Other Ambulatory Visit: Payer: Self-pay | Admitting: Obstetrics and Gynecology

## 2019-04-08 DIAGNOSIS — D241 Benign neoplasm of right breast: Secondary | ICD-10-CM | POA: Insufficient documentation

## 2020-08-31 ENCOUNTER — Other Ambulatory Visit: Payer: Self-pay

## 2020-08-31 ENCOUNTER — Encounter: Payer: Self-pay | Admitting: Emergency Medicine

## 2020-08-31 ENCOUNTER — Ambulatory Visit
Admission: EM | Admit: 2020-08-31 | Discharge: 2020-08-31 | Disposition: A | Discharge status: Home / Self Care | Payer: BC Managed Care – PPO | Attending: Sports Medicine | Admitting: Sports Medicine

## 2020-08-31 DIAGNOSIS — H5712 Ocular pain, left eye: Secondary | ICD-10-CM

## 2020-08-31 DIAGNOSIS — H00014 Hordeolum externum left upper eyelid: Secondary | ICD-10-CM | POA: Diagnosis not present

## 2020-08-31 MED ORDER — ERYTHROMYCIN 5 MG/GM OP OINT: TOPICAL_OINTMENT | OPHTHALMIC | 0 refills | Status: DC
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
## 2020-08-31 NOTE — Discharge Instructions (Signed)
You have a hordeolum in the external aspect of the left upper eyelid. I prescribed an antibiotic ointment given your situation with your pregnant daughter. I also provided educational handouts. Please continue with the warm compresses. Over-the-counter meds as needed. If symptoms persist please see your primary care provider.  If they worsen please go to the ER.

## 2020-09-03 NOTE — ED Provider Notes (Signed)
[facility]    CSN: [id number] Arrival date & time: 08/31/20  1341      History   Chief Complaint Chief Complaint  Patient presents with  . Eye Problem    HPI Angela [name] is a 40 y.o. female.   Patient is a pleasant 40 year old female presents for evaluation of the above issue.  Normally sees Dr. Lavera Guise for ongoing medical care.  She was unavailable to see her today.  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  She presents today with left upper eyelid irritation.  She did have some trauma to that area about 48 hours ago when a child threw a toy at her.  That said she had no symptoms at that time.  Swelling began last night.  No vision changes.  No painful vision, double vision, or blurry vision.  She has no foreign body sensation.  Is been no discharge from the eye.  She has been using warm compresses.  Complicating her situation is that she does have an 6-month pregnant daughter that is around and there are some concern about her having an infection and spreading it.  She denies any chest pain, shortness of breath, upper respiratory symptoms, or COVID-like symptoms.  No red flag signs or symptoms elicited on history.     Past Medical History:  Diagnosis Date  . Abnormal Pap smear of cervix   . Anxiety   . Asthma   . Complication of anesthesia    blood pressure drop  . Depression   . Environmental and seasonal allergies   . GERD (gastroesophageal reflux disease)   . Herpes genitalis   . Vitamin D deficiency     There are no problems to display for this patient.   Past Surgical History:  Procedure Laterality Date  . APPENDECTOMY    . BREAST BIOPSY Right 10/08/2018   fibroadenoma  . BREAST REDUCTION SURGERY    . LAPAROSCOPIC TUBAL LIGATION Bilateral 09/28/2017   Procedure: LAPAROSCOPIC BILATERAL TUBAL LIGATION;  Surgeon: Rubie Maid, MD;  Location: [facility] ORS;  Service: Gynecology;  Laterality: Bilateral;  . MOUTH  SURGERY    . REDUCTION MAMMAPLASTY Bilateral 2000  . skin cyst    . TONSILLECTOMY      OB History    Gravida  2   Para  2   Term  2   Preterm      AB      Living  2     SAB      IAB      Ectopic      Multiple  0   Live Births  2            Home Medications    Prior to Admission medications   Medication Sig Start Date End Date Taking? Authorizing Provider  erythromycin ophthalmic ointment Place a 1/2 inch ribbon of ointment into the lower eyelid. 08/31/20  Yes Verda Cumins, MD  albuterol (PROVENTIL HFA;VENTOLIN HFA) 108 (90 Base) MCG/ACT inhaler Inhale 2 puffs into the lungs every 4 (four) hours as needed for wheezing or shortness of breath. 11/23/16   Marylene Land, NP  azelastine (ASTELIN) 0.1 % nasal spray PLACE 1 SPRAY INTO BOTH NOSTRILS DAILY. 01/13/18   Shambley, Melody N, CNM  fluticasone (FLONASE) 50 MCG/ACT nasal spray Place 1 spray into both nostrils daily.     [provider]  ibuprofen (ADVIL,MOTRIN) 600 MG tablet Take 1 tablet (600 mg total) by mouth every  6 (six) hours. 08/04/17   Philip Aspen, CNM  ketotifen (ZADITOR) 0.025 % ophthalmic solution Place 1 drop into both eyes daily as needed (allergies).    [provider]  moxifloxacin (VIGAMOX) 0.5 % ophthalmic solution Place 1 drop into both eyes 3 (three) times daily. 07/13/18   Shambley, Melody N, CNM  Multiple Vitamin (MULTIVITAMIN) tablet Take 1 tablet by mouth daily.    [provider]  oseltamivir (TAMIFLU) 75 MG capsule Take 1 capsule (75 mg total) by mouth every 12 (twelve) hours. 06/02/18   Coral Spikes, DO  valACYclovir (VALTREX) 500 MG tablet Take 1 tablet (500 mg total) by mouth 2 (two) times daily. 07/15/17   Joylene Igo, CNM    Family History Family History  Adopted: Yes  Family history unknown: Yes    Social History Social History   Tobacco Use  . Smoking status: Never Smoker  . Smokeless tobacco: Never Used  Vaping Use  . Vaping Use:  Never used  Substance Use Topics  . Alcohol use: Yes    Comment: occas  . Drug use: No     Allergies   Peanut-containing drug products   Review of Systems Review of Systems  Constitutional: Negative for activity change, chills, diaphoresis, fatigue and fever.  HENT: Negative for congestion, ear pain, sinus pressure, sinus pain and sore throat.   Eyes: Positive for pain. Negative for photophobia, discharge, redness, itching and visual disturbance.       Is more of a discomfort localized to the upper eyelid on the left side.  Respiratory: Negative for cough, chest tightness, shortness of breath and wheezing.   Cardiovascular: Negative for chest pain and palpitations.  Gastrointestinal: Negative for abdominal pain.  Genitourinary: Negative for dysuria.  Musculoskeletal: Negative for arthralgias, myalgias, neck pain and neck stiffness.  Skin: Positive for color change. Negative for pallor, rash and wound.       Little erythema of the upper eyelid of the left eye.  Neurological: Negative for dizziness, light-headedness and headaches.  All other systems reviewed and are negative.    Physical Exam Triage Vital Signs ED Triage Vitals  Enc Vitals Group     BP 08/31/20 1351 121/75     Pulse Rate 08/31/20 1351 70     Resp 08/31/20 1351 18     Temp 08/31/20 1351 98.3 F (36.8 C)     Temp Source 08/31/20 1351 Oral     SpO2 08/31/20 1351 100 %     Weight --      Height --      Head Circumference --      Peak Flow --      Pain Score 08/31/20 1349 0     Pain Loc --      Pain Edu? --      Excl. in [place]? --    No data found.  Updated Vital Signs BP 121/75 (BP Location: Left Arm)   Pulse 70   Temp 98.3 F (36.8 C) (Oral)   Resp 18   LMP 08/01/2020   SpO2 100%   Breastfeeding No   Visual Acuity Right Eye Distance:   Left Eye Distance:   Bilateral Distance:    Right Eye Near:   Left Eye Near:    Bilateral Near:     Physical Exam Vitals and nursing note reviewed.   Constitutional:      General: She is not in acute distress.    Appearance: Normal appearance. She is not ill-appearing, toxic-appearing  or diaphoretic.  HENT:     Head: Normocephalic and atraumatic.     Nose: Nose normal.     Mouth/Throat:     Mouth: Mucous membranes are moist.     Pharynx: No oropharyngeal exudate or posterior oropharyngeal erythema.  Eyes:     General: Vision grossly intact. No visual field deficit or scleral icterus.       Right eye: No foreign body, discharge or hordeolum.        Left eye: Hordeolum present.No foreign body or discharge.     Extraocular Movements: Extraocular movements intact.     Right eye: Normal extraocular motion and no nystagmus.     Left eye: Normal extraocular motion and no nystagmus.     Conjunctiva/sclera: Conjunctivae normal.     Pupils: Pupils are equal, round, and reactive to light.  Cardiovascular:     Rate and Rhythm: Normal rate and regular rhythm.     Pulses: Normal pulses.  Pulmonary:     Effort: Pulmonary effort is normal.     Breath sounds: Normal breath sounds.  Musculoskeletal:     Cervical back: Normal range of motion and neck supple.  Lymphadenopathy:     Cervical: No cervical adenopathy.  Skin:    General: Skin is warm and dry.     Capillary Refill: Capillary refill takes less than 2 seconds.  Neurological:     General: No focal deficit present.     Mental Status: She is alert and oriented to person, place, and time.  Psychiatric:        Mood and Affect: Mood normal.      UC Treatments / Results  Labs (all labs ordered are listed, but only abnormal results are displayed) Labs Reviewed - No data to display  EKG   Radiology No results found.  Procedures Procedures (including critical care time)  Medications Ordered in UC Medications - No data to display  Initial Impression / Assessment and Plan / UC Course  I have reviewed the triage vital signs and the nursing notes.  Pertinent labs & imaging  results that were available during my care of the patient were reviewed by me and considered in my medical decision making (see chart for details).  Clinical impression: Left eye discomfort with hordeolum of the left upper eyelid.  Patient has been doing warm compresses but is still symptomatic.  Treatment plan: 1.  The findings and treatment plan were discussed in detail with the patient.  Patient was in agreement. 2.  We discussed just watchful waiting and continue with outpatient management.  Given her family situation she wanted to be treated.  I did not feel that was unreasonable.  We went ahead and gave her erythromycin ophthalmic ointment is sent to her pharmacy. 3.  Educational handouts provided. 4.  Continue with supportive care including the warm compresses. 5.  If symptoms persist she should see her primary care provider. 6.  If they worsen she should go to the ER. 7.  She was discharged in stable condition and she will follow-up here as needed.    Final Clinical Impressions(s) / UC Diagnoses   Final diagnoses:  Hordeolum externum of left upper eyelid  Left eye pain     Discharge Instructions     You have a hordeolum in the external aspect of the left upper eyelid. I prescribed an antibiotic ointment given your situation with your pregnant daughter. I also provided educational handouts. Please continue with the warm compresses. Over-the-counter meds as  needed. If symptoms persist please see your primary care provider.  If they worsen please go to the ER.    ED Prescriptions    Medication Sig Dispense Auth. Provider   erythromycin ophthalmic ointment Place a 1/2 inch ribbon of ointment into the lower eyelid. 3.5 g Verda Cumins, MD     PDMP not reviewed this encounter.   Verda Cumins, MD 09/03/20 [phone]

## 2020-09-11 ENCOUNTER — Encounter: Payer: Self-pay | Admitting: Obstetrics and Gynecology

## 2020-09-11 ENCOUNTER — Ambulatory Visit: Payer: BC Managed Care – PPO | Admitting: Obstetrics and Gynecology

## 2020-09-11 ENCOUNTER — Other Ambulatory Visit: Payer: Self-pay

## 2020-09-11 VITALS — BP 119/80 | HR 83 | Ht 68.0 in | Wt 183.2 lb

## 2020-09-11 DIAGNOSIS — N920 Excessive and frequent menstruation with regular cycle: Secondary | ICD-10-CM | POA: Diagnosis not present

## 2020-09-11 MED ORDER — ETONOGESTREL-ETHINYL ESTRADIOL 0.12-0.015 MG/24HR VA RING: VAGINAL_RING | VAGINAL | 1 refills | Status: DC

## 2020-09-11 NOTE — Progress Notes (Signed)
HPI:      [name] is a 40 y.o. Q4B2010 who LMP was Patient's last menstrual period was 09/04/2020.  Subjective:   She presents today stating that she is having normal regular menses however they are becoming heavier each month.  She does state that her last period involved passing significant clots.  She denies pain with her menses. Of significant note, she has a tubal ligation for birth control.    Hx: The following portions of the patient's history were reviewed and updated as appropriate:             She  has a past medical history of Abnormal Pap smear of cervix, Anxiety, Asthma, Complication of anesthesia, Depression, Environmental and seasonal allergies, GERD (gastroesophageal reflux disease), Herpes genitalis, and Vitamin D deficiency. She does not have any pertinent problems on file. She  has a past surgical history that includes Appendectomy; Breast reduction surgery; Tonsillectomy; skin cyst; Mouth surgery; Laparoscopic tubal ligation (Bilateral, 09/28/2017); Reduction mammaplasty (Bilateral, 2000); and Breast biopsy (Right, 10/08/2018). Her She was adopted. Family history is unknown by patient. She  reports that she has never smoked. She has never used smokeless tobacco. She reports current alcohol use. She reports that she does not use drugs. She has a current medication list which includes the following prescription(s): albuterol, azelastine, erythromycin, etonogestrel-ethinyl estradiol, fluticasone, ibuprofen, ketotifen, multivitamin, and valacyclovir, and the following Facility-Administered Medications: moxifloxacin. She is allergic to peanut-containing drug products.       Review of Systems:  Review of Systems  Constitutional: Denied constitutional symptoms, night sweats, recent illness, fatigue, fever, insomnia and weight loss.  Eyes: Denied eye symptoms, eye pain, photophobia, vision change and visual disturbance.  Ears/Nose/Throat/Neck: Denied ear, nose, throat  or neck symptoms, hearing loss, nasal discharge, sinus congestion and sore throat.  Cardiovascular: Denied cardiovascular symptoms, arrhythmia, chest pain/pressure, edema, exercise intolerance, orthopnea and palpitations.  Respiratory: Denied pulmonary symptoms, asthma, pleuritic pain, productive sputum, cough, dyspnea and wheezing.  Gastrointestinal: Denied, gastro-esophageal reflux, melena, nausea and vomiting.  Genitourinary: See HPI for additional information.  Musculoskeletal: Denied musculoskeletal symptoms, stiffness, swelling, muscle weakness and myalgia.  Dermatologic: Denied dermatology symptoms, rash and scar.  Neurologic: Denied neurology symptoms, dizziness, headache, neck pain and syncope.  Psychiatric: Denied psychiatric symptoms, anxiety and depression.  Endocrine: Denied endocrine symptoms including hot flashes and night sweats.   Meds:   Current Outpatient Medications on File Prior to Visit  Medication Sig Dispense Refill  . albuterol (PROVENTIL HFA;VENTOLIN HFA) 108 (90 Base) MCG/ACT inhaler Inhale 2 puffs into the lungs every 4 (four) hours as needed for wheezing or shortness of breath. 1 Inhaler 0  . azelastine (ASTELIN) 0.1 % nasal spray PLACE 1 SPRAY INTO BOTH NOSTRILS DAILY. 30 mL 2  . erythromycin ophthalmic ointment Place a 1/2 inch ribbon of ointment into the lower eyelid. 3.5 g 0  . fluticasone (FLONASE) 50 MCG/ACT nasal spray Place 1 spray into both nostrils daily.     Marland Kitchen ibuprofen (ADVIL,MOTRIN) 600 MG tablet Take 1 tablet (600 mg total) by mouth every 6 (six) hours. 30 tablet 0  . ketotifen (ZADITOR) 0.025 % ophthalmic solution Place 1 drop into both eyes daily as needed (allergies).    . Multiple Vitamin (MULTIVITAMIN) tablet Take 1 tablet by mouth daily.    . valACYclovir (VALTREX) 500 MG tablet Take 1 tablet (500 mg total) by mouth 2 (two) times daily. 60 tablet 2   Current Facility-Administered Medications on File Prior to Visit  Medication Dose  Route  Frequency Provider Last Rate Last Admin  . moxifloxacin (VIGAMOX) 0.5 % ophthalmic solution 1 drop  1 drop Both Eyes TID Shambley, Melody N, CNM              Objective:     Vitals:   09/11/20 1526  BP: 119/80  Pulse: 83   Filed Weights   09/11/20 1526  Weight: 183 lb 3.2 oz (83.1 kg)                Assessment:    G2P2002 There are no problems to display for this patient.    1. Menorrhagia with regular cycle     Patient having heavier menstrual bleeding with regular cycle.  She would like something so that her bleeding is not so heavy.  She knows that this likely involves hormonal cycling.  She reports that she has tried Mirena in the past and it was not for her.  She says that she has successfully used OCPs and NuvaRing.   Plan:            1.  Patient has chosen NuvaRing.  She would like to take it in a 73-month cycling pattern.  We have discussed this in detail.  4 weeks 4 weeks 4 weeks then menstrual period.    2.  She is due for a Pap in August and we will follow-up her NuvaRing at that time make sure it is working effectively. Orders No orders of the defined types were placed in this encounter.    Meds ordered this encounter  Medications  . etonogestrel-ethinyl estradiol (NUVARING) 0.12-0.015 MG/24HR vaginal ring    Sig: As directed    Dispense:  3 each    Refill:  1      F/U  Return in about 3 months (around 12/12/2020) for Annual Physical. I spent 22 minutes involved in the care of this patient preparing to see the patient by obtaining and reviewing her medical history (including labs, imaging tests and prior procedures), documenting clinical information in the electronic health record (EHR), counseling and coordinating care plans, writing and sending prescriptions, ordering tests or procedures and directly communicating with the patient by discussing pertinent items from her history and physical exam as well as detailing my assessment and plan as noted above  so that she has an informed understanding.  All of her questions were answered.  Finis Bud, M.D. 09/11/2020 3:45 PM

## 2020-12-05 ENCOUNTER — Other Ambulatory Visit: Payer: Self-pay | Admitting: Family Medicine

## 2020-12-05 DIAGNOSIS — R928 Other abnormal and inconclusive findings on diagnostic imaging of breast: Secondary | ICD-10-CM

## 2020-12-14 ENCOUNTER — Other Ambulatory Visit: Payer: Self-pay

## 2020-12-14 ENCOUNTER — Ambulatory Visit
Admission: RE | Admit: 2020-12-14 | Discharge: 2020-12-14 | Disposition: A | Discharge status: Home / Self Care | Payer: BC Managed Care – PPO | Source: Ambulatory Visit | Attending: Family Medicine | Admitting: Family Medicine

## 2020-12-14 ENCOUNTER — Other Ambulatory Visit: Payer: BC Managed Care – PPO

## 2020-12-14 DIAGNOSIS — R928 Other abnormal and inconclusive findings on diagnostic imaging of breast: Secondary | ICD-10-CM

## 2021-03-03 ENCOUNTER — Other Ambulatory Visit: Payer: Self-pay | Admitting: Obstetrics and Gynecology

## 2021-03-03 DIAGNOSIS — N920 Excessive and frequent menstruation with regular cycle: Secondary | ICD-10-CM

## 2021-05-01 ENCOUNTER — Other Ambulatory Visit: Payer: Self-pay | Admitting: Family Medicine

## 2021-05-02 ENCOUNTER — Other Ambulatory Visit: Payer: Self-pay | Admitting: Family Medicine

## 2021-05-02 DIAGNOSIS — N6320 Unspecified lump in the left breast, unspecified quadrant: Secondary | ICD-10-CM

## 2021-06-21 ENCOUNTER — Ambulatory Visit
Admission: RE | Admit: 2021-06-21 | Discharge: 2021-06-21 | Disposition: A | Discharge status: Home / Self Care | Payer: No Typology Code available for payment source | Source: Ambulatory Visit | Attending: Family Medicine | Admitting: Family Medicine

## 2021-06-21 ENCOUNTER — Other Ambulatory Visit: Payer: Self-pay

## 2021-06-21 DIAGNOSIS — N6320 Unspecified lump in the left breast, unspecified quadrant: Secondary | ICD-10-CM | POA: Insufficient documentation

## 2021-06-24 ENCOUNTER — Other Ambulatory Visit: Payer: Self-pay | Admitting: Family Medicine

## 2021-06-24 DIAGNOSIS — R928 Other abnormal and inconclusive findings on diagnostic imaging of breast: Secondary | ICD-10-CM

## 2021-06-24 DIAGNOSIS — N63 Unspecified lump in unspecified breast: Secondary | ICD-10-CM

## 2021-07-10 ENCOUNTER — Ambulatory Visit
Admission: RE | Admit: 2021-07-10 | Discharge: 2021-07-10 | Disposition: A | Discharge status: Home / Self Care | Payer: No Typology Code available for payment source | Source: Ambulatory Visit | Attending: Family Medicine | Admitting: Family Medicine

## 2021-07-10 ENCOUNTER — Other Ambulatory Visit: Payer: Self-pay

## 2021-07-10 DIAGNOSIS — R928 Other abnormal and inconclusive findings on diagnostic imaging of breast: Secondary | ICD-10-CM | POA: Diagnosis present

## 2021-07-10 DIAGNOSIS — N63 Unspecified lump in unspecified breast: Secondary | ICD-10-CM | POA: Insufficient documentation

## 2021-07-10 HISTORY — PX: BREAST BIOPSY: SHX20

## 2021-07-11 LAB — SURGICAL PATHOLOGY

## 2021-08-25 ENCOUNTER — Other Ambulatory Visit: Payer: Self-pay | Admitting: Obstetrics and Gynecology

## 2021-08-25 DIAGNOSIS — N920 Excessive and frequent menstruation with regular cycle: Secondary | ICD-10-CM

## 2021-09-25 IMAGING — US US BREAST*L* LIMITED INC AXILLA
1 series · 6 of 6 positions shown · non-contrast
Comparison: Previous exam(s).

CLINICAL DATA: Short-term follow-up for a probably benign area of
left breast asymmetry. A cystic lesion noted on prior diagnostic
imaging, felt likely to correlate to at least a component of the
asymmetry, showed a significant decrease in size on the prior
ultrasound, from 8 x 5 x 8 mm to 3 x 3 x 4 mm, confirming benignity.

EXAM:
DIGITAL DIAGNOSTIC BILATERAL MAMMOGRAM WITH TOMOSYNTHESIS AND CAD;
ULTRASOUND LEFT BREAST LIMITED
TECHNIQUE: Bilateral digital diagnostic mammography and breast tomosynthesis
was performed. The images were evaluated with computer-aided
detection.; Targeted ultrasound examination of the left breast was
performed.

[Series 1: us breast*left* limited inc axilla · 0.06mm/px · 6 of 6 slices shown]
[im 1/6]
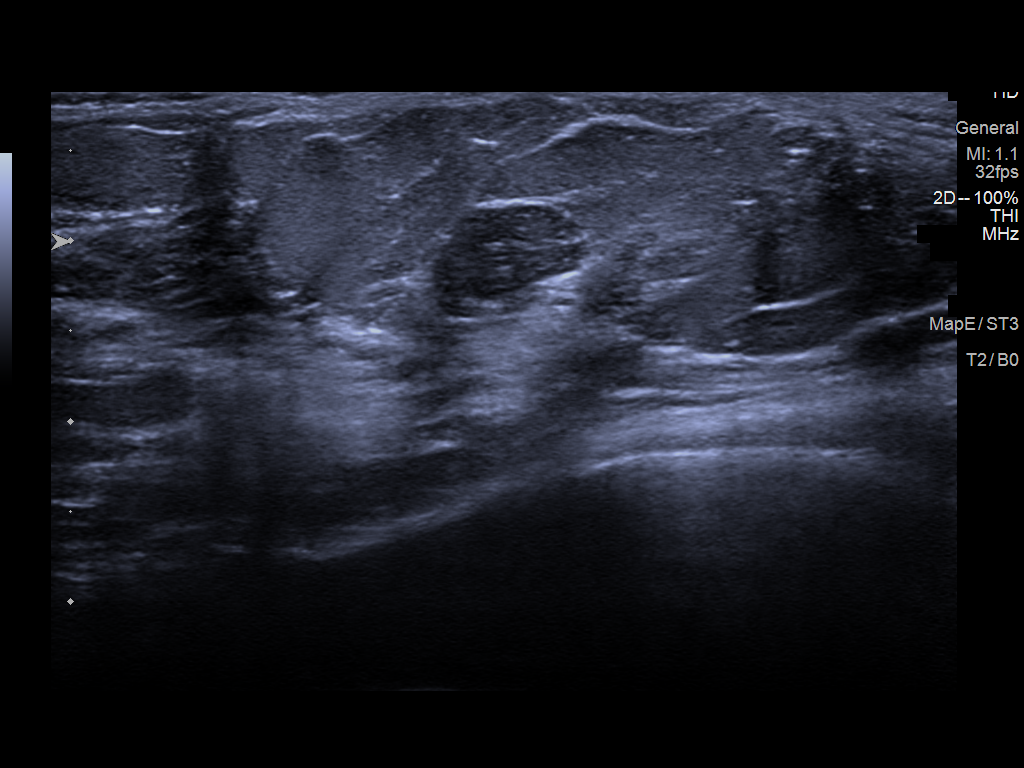
[im 2/6]
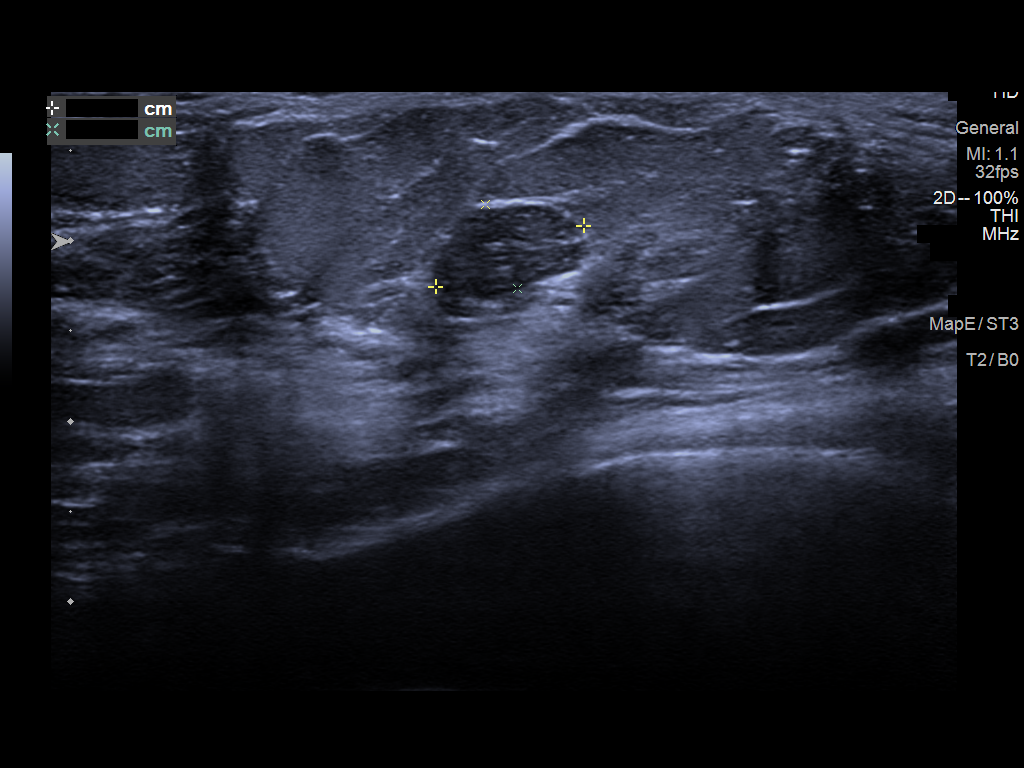
[im 3/6]
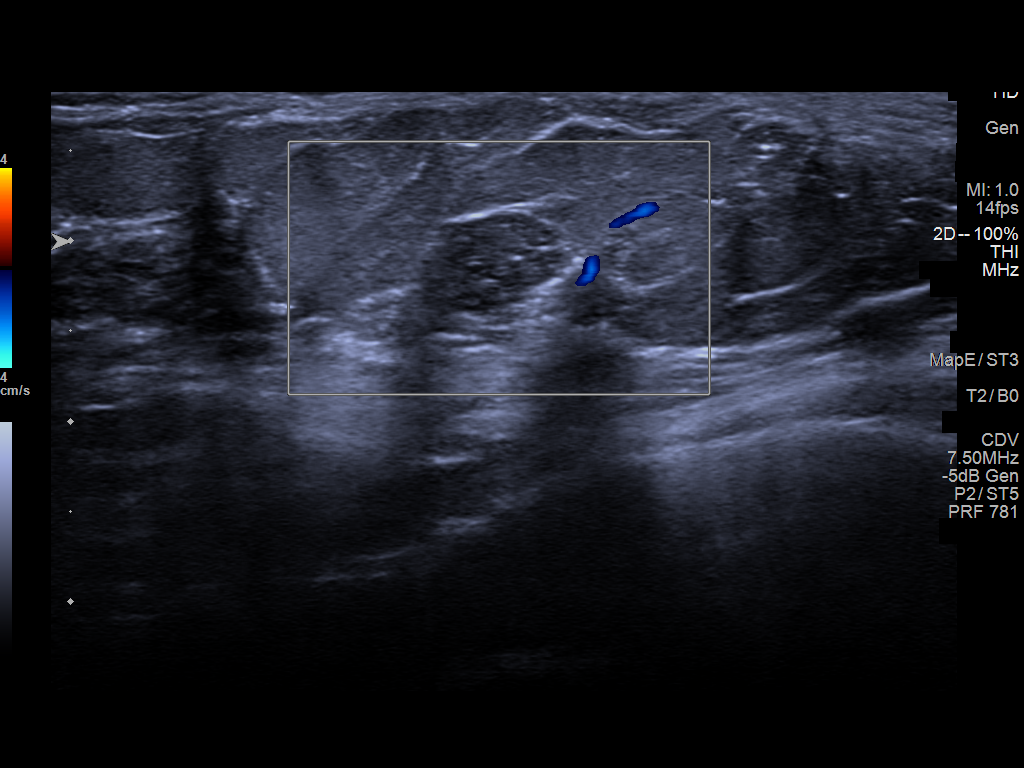
[im 4/6]
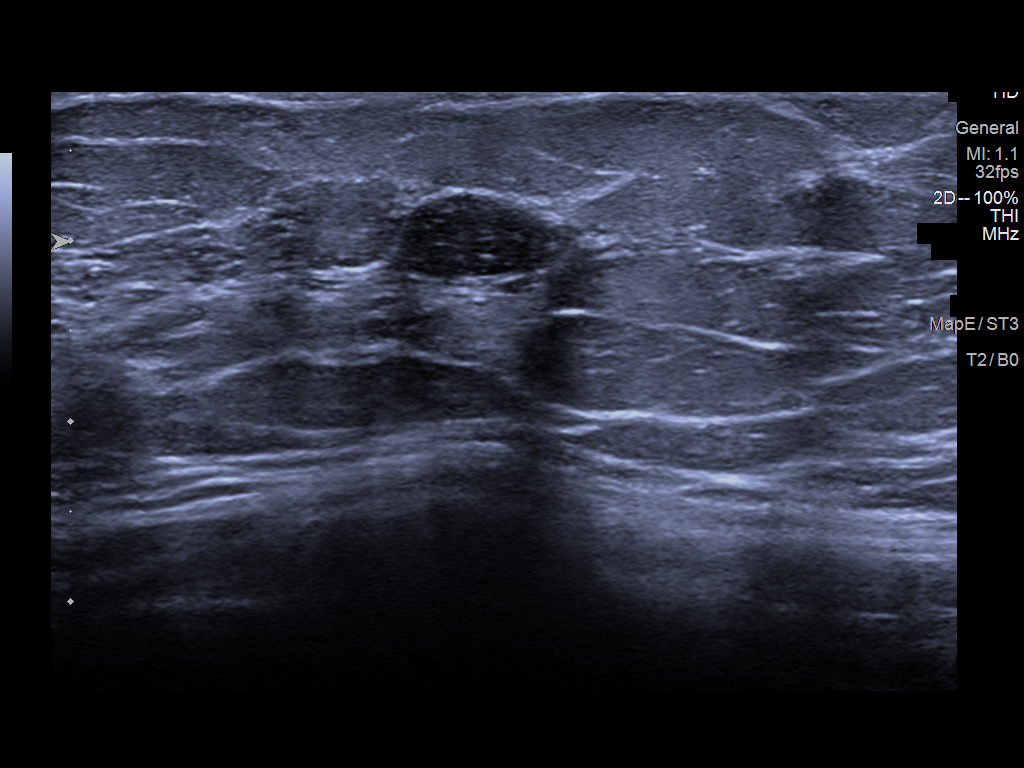
[im 5/6]
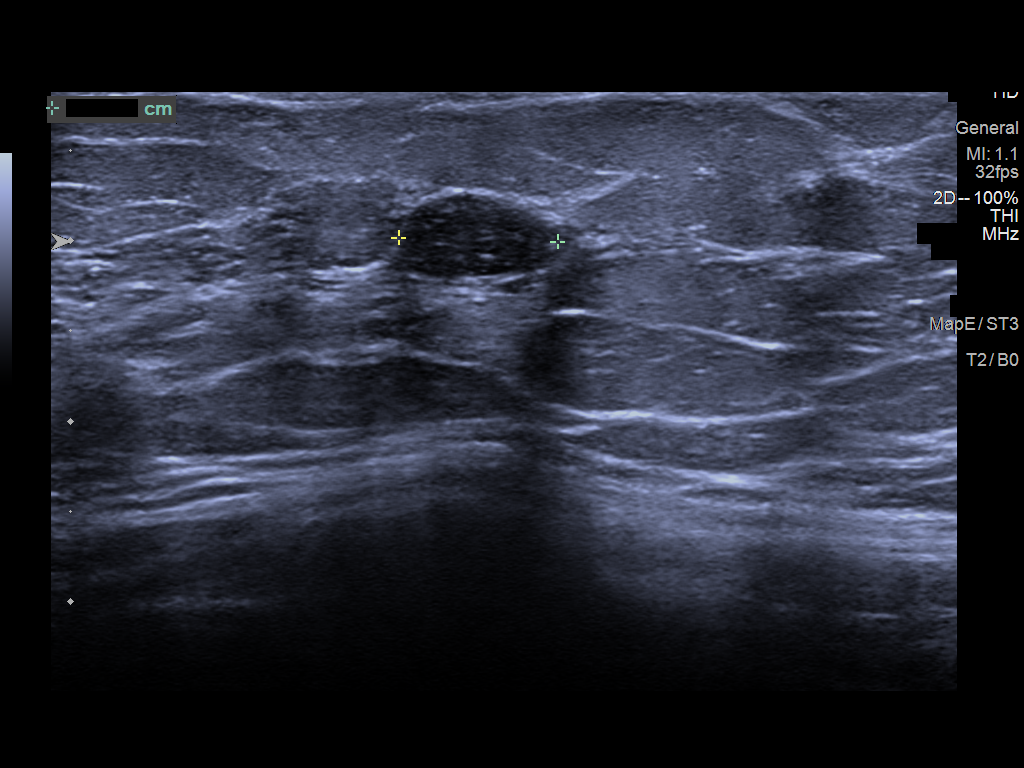
[im 6/6]
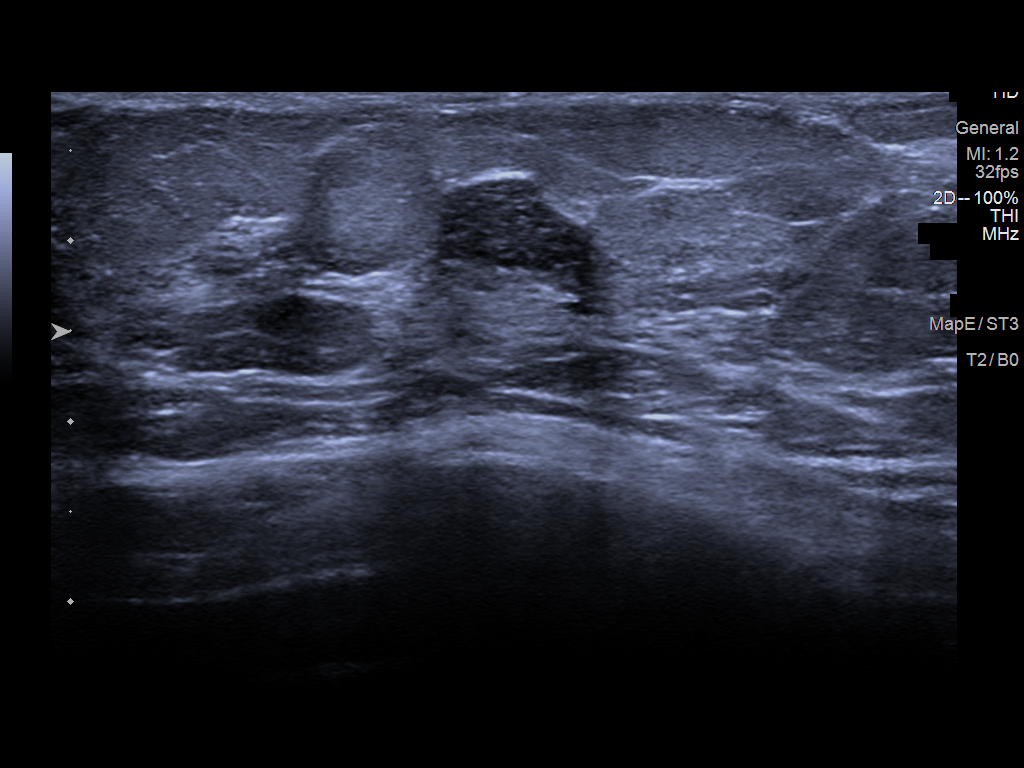

[6 of 6 positions shown; findings below may reference images not displayed]

ACR Breast Density Category c: The breast tissue is heterogeneously
dense, which may obscure small masses.
FINDINGS: The area of asymmetry noted on the prior exam in the lateral left
breast is less apparent.

There is an oval partly circumscribed and partly obscured mass in
the inferior aspect of the left breast, not evident on the prior
exams. This measures approximately 8 mm in long axis.

There are no other masses, no new or suspicious areas of asymmetry,
no areas of architectural distortion and no suspicious
calcifications.

On physical exam, no mass is palpated in the inferior left breast.
There is a midline scar extending from the left nipple to the
inframammary fold from the patient's prior breast reduction surgery.

Targeted ultrasound is performed, showing a subtle oval mildly
hypoechoic circumscribed mass at 5:30 o'clock, 7 cm the nipple, near
the inferior margin of the breast reduction scar. This is only
visualized separate from the normal breast tissue utilizing
harmonics. It measures 9 x 5 x 9 mm, consistent in size, shape and
location to the mammographic mass.
IMPRESSION: 1. Probably benign 9 mm mass in the inferior left breast at 5:30
o'clock, 7 cm the nipple. Short-term follow-up recommended.
2. Benign area of asymmetry in the lateral left breast, stable for
over 2 years.

RECOMMENDATION:
1. Diagnostic left breast mammography and ultrasound in 6 months.

I have discussed the findings and recommendations with the patient.
If applicable, a reminder letter will be sent to the patient
regarding the next appointment.

BI-RADS CATEGORY  3: Probably benign.

## 2021-09-25 IMAGING — MG DIGITAL DIAGNOSTIC BILAT W/ TOMO W/ CAD
7 series · 8 of 23 positions shown · non-contrast
Comparison: Previous exam(s).

CLINICAL DATA: Short-term follow-up for a probably benign area of
left breast asymmetry. A cystic lesion noted on prior diagnostic
imaging, felt likely to correlate to at least a component of the
asymmetry, showed a significant decrease in size on the prior
ultrasound, from 8 x 5 x 8 mm to 3 x 3 x 4 mm, confirming benignity.

EXAM:
DIGITAL DIAGNOSTIC BILATERAL MAMMOGRAM WITH TOMOSYNTHESIS AND CAD;
ULTRASOUND LEFT BREAST LIMITED
TECHNIQUE: Bilateral digital diagnostic mammography and breast tomosynthesis
was performed. The images were evaluated with computer-aided
detection.; Targeted ultrasound examination of the left breast was
performed.

[L MLO synth-2D]
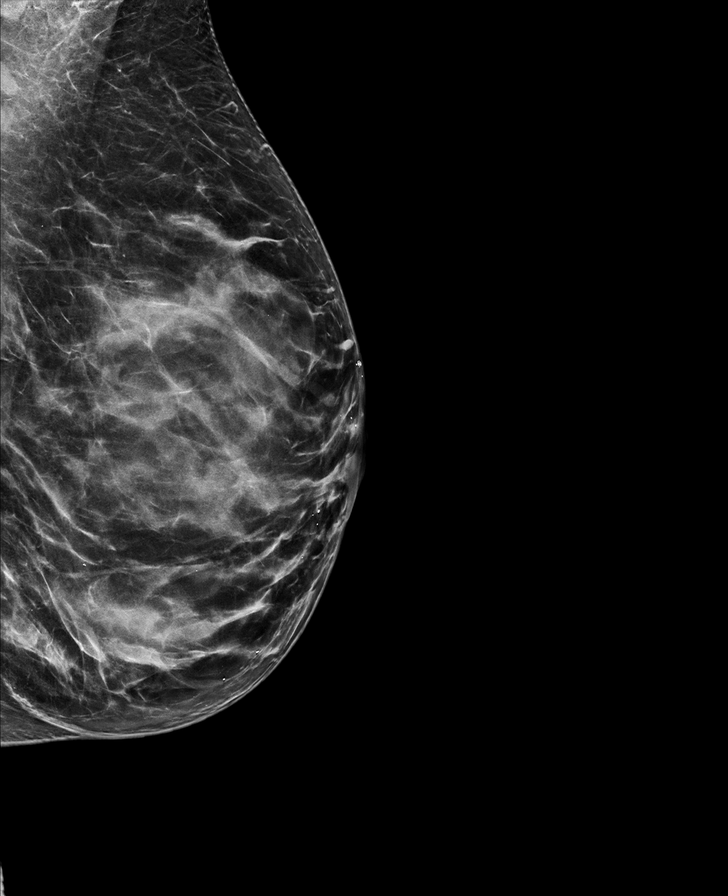

[R MLO synth-2D]
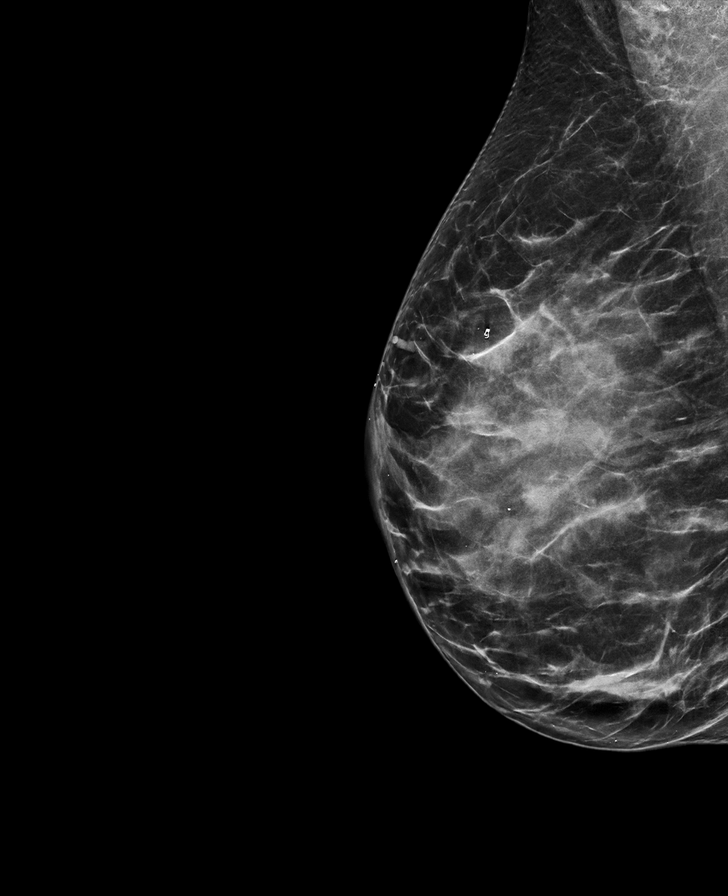

[R CC synth-2D]
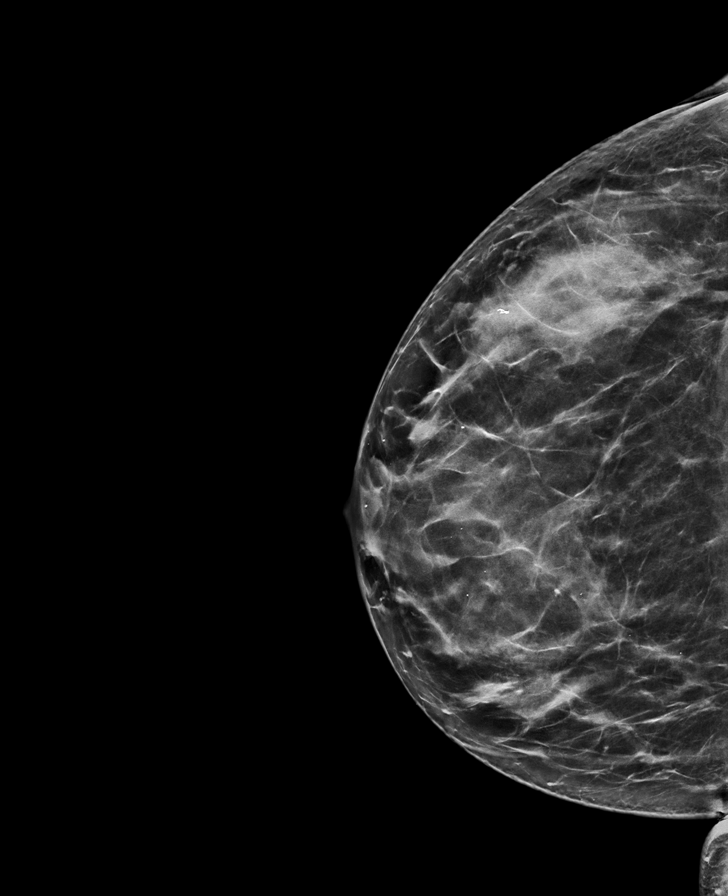

[L MLO tomo · 2 of 70 frames shown]
[frame 23/70]
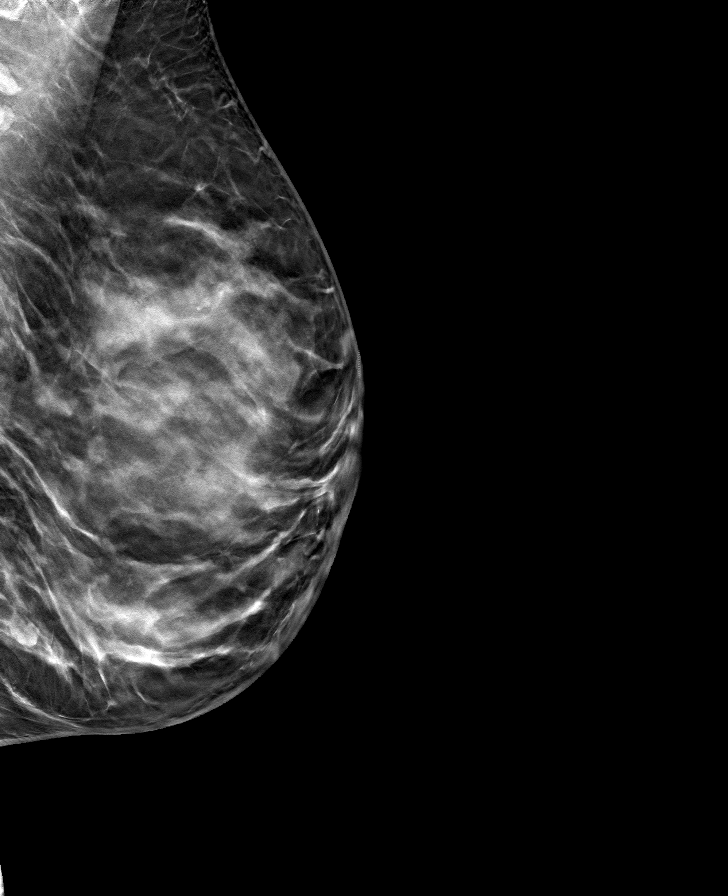
[frame 35/70]
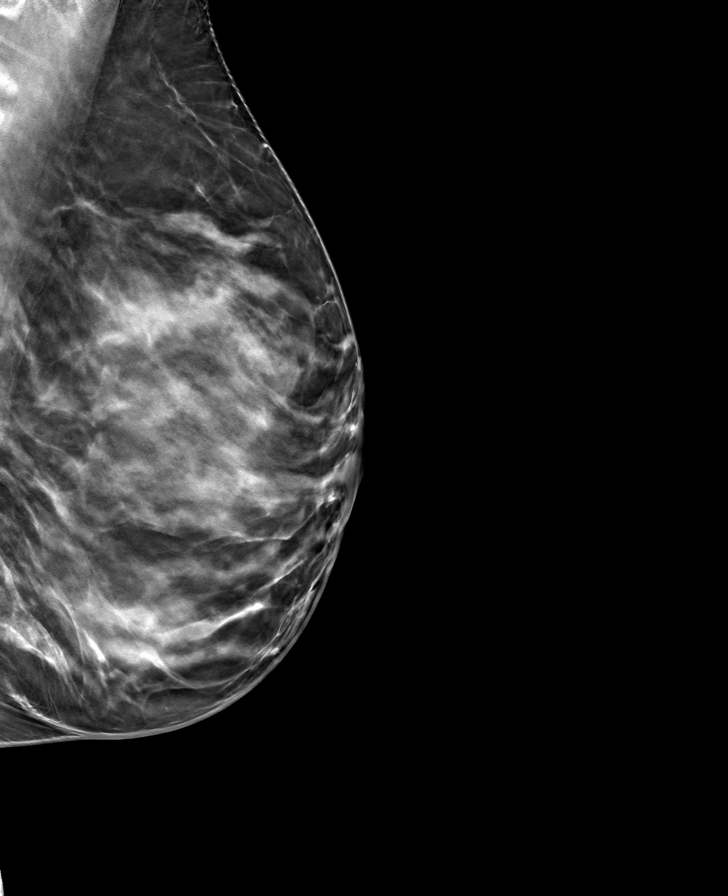

[R CC tomo · tomo slice 35/69.0]
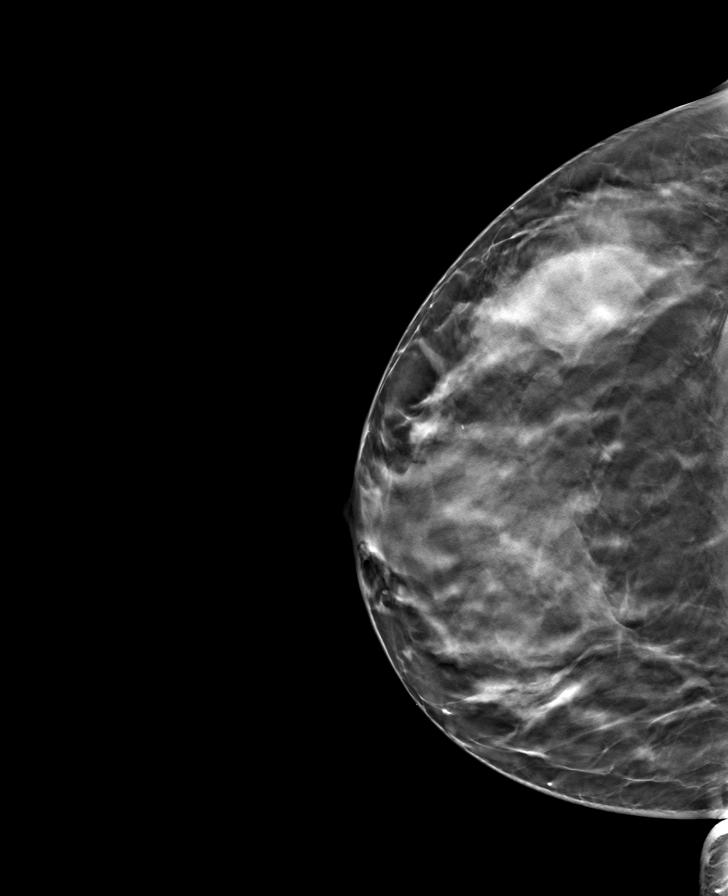

[R MLO tomo · tomo slice 35/69.0]
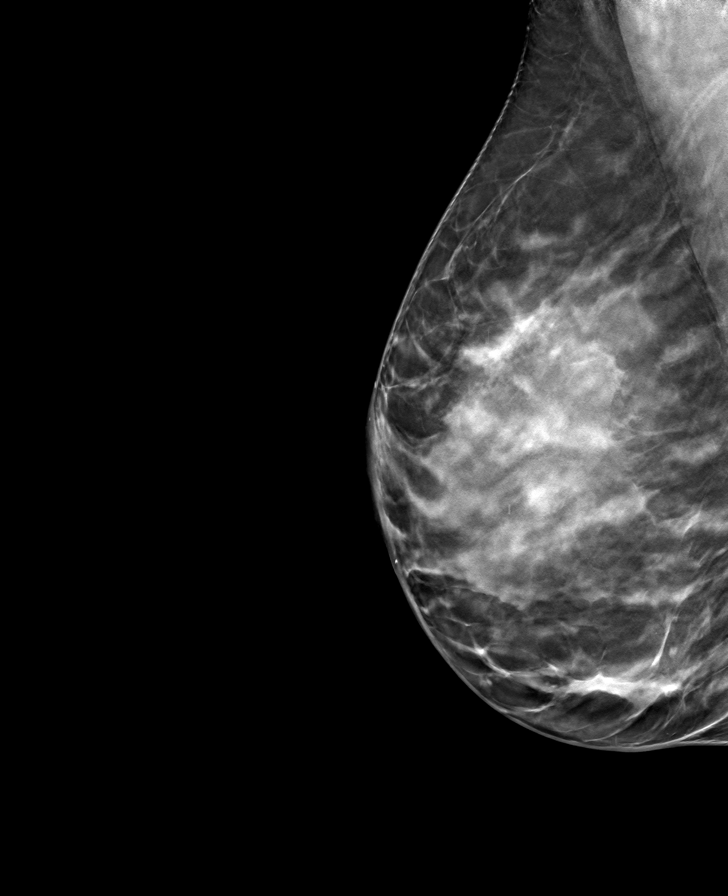

[L CC tomo · tomo slice 39/76.0]
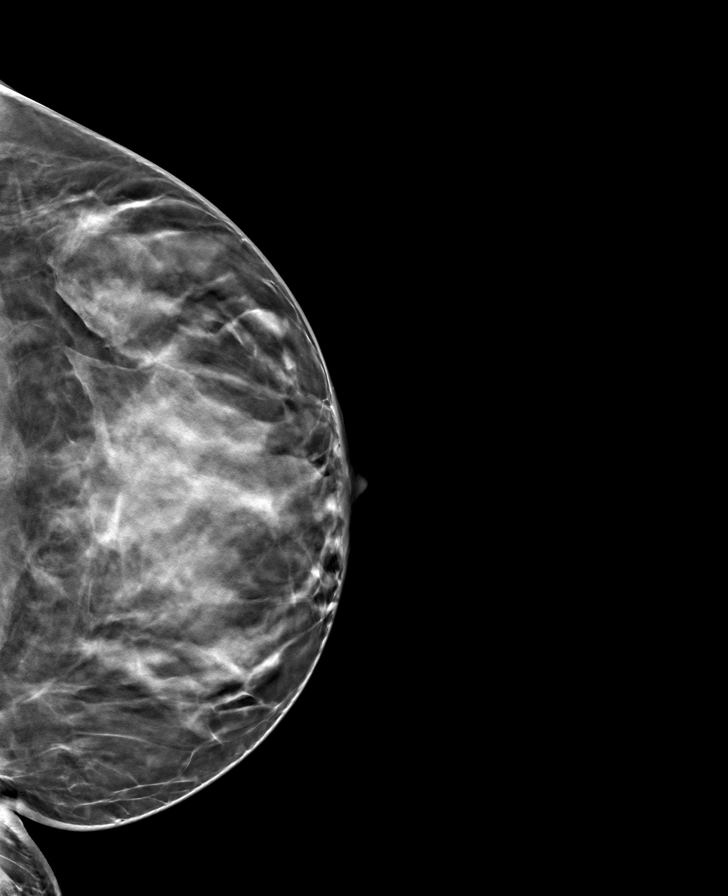

[8 of 23 positions shown; findings below may reference images not displayed]

ACR Breast Density Category c: The breast tissue is heterogeneously
dense, which may obscure small masses.
FINDINGS: The area of asymmetry noted on the prior exam in the lateral left
breast is less apparent.

There is an oval partly circumscribed and partly obscured mass in
the inferior aspect of the left breast, not evident on the prior
exams. This measures approximately 8 mm in long axis.

There are no other masses, no new or suspicious areas of asymmetry,
no areas of architectural distortion and no suspicious
calcifications.

On physical exam, no mass is palpated in the inferior left breast.
There is a midline scar extending from the left nipple to the
inframammary fold from the patient's prior breast reduction surgery.

Targeted ultrasound is performed, showing a subtle oval mildly
hypoechoic circumscribed mass at 5:30 o'clock, 7 cm the nipple, near
the inferior margin of the breast reduction scar. This is only
visualized separate from the normal breast tissue utilizing
harmonics. It measures 9 x 5 x 9 mm, consistent in size, shape and
location to the mammographic mass.
IMPRESSION: 1. Probably benign 9 mm mass in the inferior left breast at 5:30
o'clock, 7 cm the nipple. Short-term follow-up recommended.
2. Benign area of asymmetry in the lateral left breast, stable for
over 2 years.

RECOMMENDATION:
1. Diagnostic left breast mammography and ultrasound in 6 months.

I have discussed the findings and recommendations with the patient.
If applicable, a reminder letter will be sent to the patient
regarding the next appointment.

BI-RADS CATEGORY  3: Probably benign.

## 2021-11-29 ENCOUNTER — Ambulatory Visit
Admission: EM | Admit: 2021-11-29 | Discharge: 2021-11-29 | Disposition: A | Discharge status: Home / Self Care | Payer: No Typology Code available for payment source

## 2021-11-29 ENCOUNTER — Other Ambulatory Visit: Payer: Self-pay

## 2021-11-29 DIAGNOSIS — J0100 Acute maxillary sinusitis, unspecified: Secondary | ICD-10-CM | POA: Diagnosis not present

## 2021-11-29 MED ORDER — AMOXICILLIN-POT CLAVULANATE 875-125 MG PO TABS: 1.0000 | ORAL_TABLET | Freq: Two times a day (BID) | ORAL | 0 refills | Status: AC

## 2021-11-29 MED ORDER — FLUCONAZOLE 200 MG PO TABS: 200.0000 mg | ORAL_TABLET | Freq: Every day | ORAL | 1 refills | Status: AC

## 2021-11-29 NOTE — ED Triage Notes (Signed)
Pt reports 2 days of sinus congestion, nasal drainage. Facial pain and pain around eyes. Endorses green secretions from nose. "I have a sinus infection."

## 2021-11-29 NOTE — Discharge Instructions (Signed)
The Augmentin twice daily with food for 10 days for treatment of your sinusitis.  Perform sinus irrigation 2-3 times a day with a [employer] sinus rinse kit and distilled water.  Do not use tap water.  You can use plain over-the-counter Mucinex every 6 hours to break up the stickiness of the mucus so your body can clear it.  Increase your oral fluid intake to thin out your mucus so that is also able for your body to clear more easily.  Take an over-the-counter probiotic, such as Culturelle-align-activia, 1 hour after each dose of antibiotic to prevent diarrhea.  If you develop any new or worsening symptoms return for reevaluation or see your primary care provider.

## 2021-11-29 NOTE — ED Provider Notes (Signed)
[facility]    CSN: [id number] Arrival date & time: 11/29/21  1411      History   Chief Complaint Chief Complaint  Patient presents with   Nasal Congestion   Facial Pain    HPI Angela [name] is a 41 y.o. female.   HPI  41 year old female here for evaluation of respiratory complaints.  Patient reports that for last couple days she has been experiencing nasal congestion with facial pain and green nasal discharge.  She reports that this is typical of her for her sinus infections.  She has been using sinus irrigation at home to help alleviate some of the mucus burden and relieve some of her symptoms, as well as ibuprofen.  She denies fever, ear pain, sore throat, cough, GI complaints, or body aches.  Past Medical History:  Diagnosis Date   Abnormal Pap smear of cervix    Anxiety    Asthma    Complication of anesthesia    blood pressure drop   Depression    Environmental and seasonal allergies    GERD (gastroesophageal reflux disease)    Herpes genitalis    Vitamin D deficiency     There are no problems to display for this patient.   Past Surgical History:  Procedure Laterality Date   APPENDECTOMY     BREAST BIOPSY Right 10/08/2018   fibroadenoma   BREAST BIOPSY Left 07/10/2021   [place] bx, coil marker, path pending   BREAST REDUCTION SURGERY  2000   LAPAROSCOPIC TUBAL LIGATION Bilateral 09/28/2017   Procedure: LAPAROSCOPIC BILATERAL TUBAL LIGATION;  Surgeon: Rubie Maid, MD;  Location: [facility] ORS;  Service: Gynecology;  Laterality: Bilateral;   MOUTH SURGERY     REDUCTION MAMMAPLASTY Bilateral 2000   skin cyst     TONSILLECTOMY      OB History     Gravida  2   Para  2   Term  2   Preterm      AB      Living  2      SAB      IAB      Ectopic      Multiple  0   Live Births  2            Home Medications    Prior to Admission medications   Medication Sig Start Date End Date Taking? Authorizing Provider   amoxicillin-clavulanate (AUGMENTIN) 875-125 MG tablet Take 1 tablet by mouth every 12 (twelve) hours for 10 days. 11/29/21 12/09/21 Yes Margarette Canada, NP  fluconazole (DIFLUCAN) 200 MG tablet Take 1 tablet (200 mg total) by mouth daily for 2 doses. 11/29/21 12/01/21 Yes Margarette Canada, NP  albuterol (PROVENTIL HFA;VENTOLIN HFA) 108 (90 Base) MCG/ACT inhaler Inhale 2 puffs into the lungs every 4 (four) hours as needed for wheezing or shortness of breath. 11/23/16   Marylene Land, NP  azelastine (ASTELIN) 0.1 % nasal spray PLACE 1 SPRAY INTO BOTH NOSTRILS DAILY. 01/13/18   Joylene Igo, CNM  etonogestrel-ethinyl estradiol (NUVARING) 0.12-0.015 MG/24HR vaginal ring USE AS DIRECTED 08/26/21   Harlin Heys, MD  fluticasone [facility]) 50 MCG/ACT nasal spray Place 1 spray into both nostrils daily.     [provider]  ibuprofen (ADVIL,MOTRIN) 600 MG tablet Take 1 tablet (600 mg total) by mouth every 6 (six) hours. 08/04/17   Philip Aspen, CNM  ketotifen (ZADITOR) 0.025 % ophthalmic solution Place 1 drop into both eyes daily as needed (allergies).    [provider]  Multiple Vitamin (MULTIVITAMIN) tablet Take 1 tablet by mouth daily.    [provider]  TYRVAYA 0.03 MG/ACT SOLN Place into both nostrils. 11/05/21   [provider]  valACYclovir (VALTREX) 500 MG tablet Take 1 tablet (500 mg total) by mouth 2 (two) times daily. 07/15/17   Joylene Igo, CNM    Family History Family History  Adopted: Yes  Family history unknown: Yes    Social History Social History   Tobacco Use   Smoking status: Never   Smokeless tobacco: Never  Vaping Use   Vaping Use: Never used  Substance Use Topics   Alcohol use: Yes    Comment: occas   Drug use: No     Allergies   Peanut-containing drug products   Review of Systems Review of Systems  Constitutional:  Negative for fever.  HENT:  Positive for congestion, rhinorrhea and sinus pressure. Negative for ear pain  and sore throat.   Respiratory:  Negative for cough.   Gastrointestinal:  Negative for diarrhea, nausea and vomiting.  Musculoskeletal:  Negative for arthralgias and myalgias.  Hematological: Negative.   Psychiatric/Behavioral: Negative.       Physical Exam Triage Vital Signs ED Triage Vitals  Enc Vitals Group     BP --      Pulse --      Resp --      Temp --      Temp src --      SpO2 --      Weight 11/29/21 1418 180 lb (81.6 kg)     Height 11/29/21 1418 '5\' 8"'  (1.727 m)     Head Circumference --      Peak Flow --      Pain Score 11/29/21 1417 3     Pain Loc --      Pain Edu? --      Excl. in [place]? --    No data found.  Updated Vital Signs BP 121/85 (BP Location: Left Arm)   Pulse 82   Temp 98.2 F (36.8 C) (Oral)   Resp 16   Ht '5\' 8"'  (1.727 m)   Wt 180 lb (81.6 kg)   LMP 11/08/2021   SpO2 100%   BMI 27.37 kg/m   Visual Acuity Right Eye Distance:   Left Eye Distance:   Bilateral Distance:    Right Eye Near:   Left Eye Near:    Bilateral Near:     Physical Exam Vitals and nursing note reviewed.  Constitutional:      Appearance: Normal appearance. She is not ill-appearing.  HENT:     Head: Normocephalic and atraumatic.     Right Ear: Tympanic membrane, ear canal and external ear normal. There is no impacted cerumen.     Left Ear: Tympanic membrane, ear canal and external ear normal. There is no impacted cerumen.     Nose: Congestion and rhinorrhea present.     Mouth/Throat:     Mouth: Mucous membranes are moist.     Pharynx: Oropharynx is clear. No posterior oropharyngeal erythema.  Cardiovascular:     Rate and Rhythm: Normal rate and regular rhythm.     Pulses: Normal pulses.     Heart sounds: Normal heart sounds. No murmur heard.    No friction rub. No gallop.  Pulmonary:     Effort: Pulmonary effort is normal.     Breath sounds: Normal breath sounds. No wheezing, rhonchi or rales.  Musculoskeletal:     Cervical  back: Normal range of motion and  neck supple.  Lymphadenopathy:     Cervical: No cervical adenopathy.  Skin:    General: Skin is warm and dry.     Capillary Refill: Capillary refill takes less than 2 seconds.     Findings: No erythema or rash.  Neurological:     General: No focal deficit present.     Mental Status: She is alert and oriented to person, place, and time.  Psychiatric:        Mood and Affect: Mood normal.        Behavior: Behavior normal.        Thought Content: Thought content normal.        Judgment: Judgment normal.      UC Treatments / Results  Labs (all labs ordered are listed, but only abnormal results are displayed) Labs Reviewed - No data to display  EKG   Radiology No results found.  Procedures Procedures (including critical care time)  Medications Ordered in UC Medications - No data to display  Initial Impression / Assessment and Plan / UC Course  I have reviewed the triage vital signs and the nursing notes.  Pertinent labs & imaging results that were available during my care of the patient were reviewed by me and considered in my medical decision making (see chart for details).  Patient is a very pleasant, nontoxic-appearing 41 year old female here for evaluation of 2 days worth of sinus pressure and purulent nasal discharge.  She denies any other upper or lower respiratory symptoms.  Physical exam reveals pearly-gray tympanic membranes bilaterally with normal light reflex and clear external auditory canals.  Nasal mucosa is erythematous and edematous with purulent discharge in both nares.  Patient does have tenderness to percussion of frontal and maxillary sinuses bilaterally.  Oropharyngeal exam is benign.  No cervical lymphadenopathy appreciable exam.  Cardiopulmonary exam reveals S1-S2 heart sounds with regular rate and rhythm and lung sounds that are clear auscultation all fields.  Patient's exam is consistent with an upper respiratory infection.  With her reported history of  multiple sinus infections I will treat her for sinusitis with Augmentin twice daily for 10 days.  I have encouraged her to continue her sinus irrigation and to return for new or worsening symptoms.   Final Clinical Impressions(s) / UC Diagnoses   Final diagnoses:  Acute non-recurrent maxillary sinusitis     Discharge Instructions      The Augmentin twice daily with food for 10 days for treatment of your sinusitis.  Perform sinus irrigation 2-3 times a day with a [employer] sinus rinse kit and distilled water.  Do not use tap water.  You can use plain over-the-counter Mucinex every 6 hours to break up the stickiness of the mucus so your body can clear it.  Increase your oral fluid intake to thin out your mucus so that is also able for your body to clear more easily.  Take an over-the-counter probiotic, such as Culturelle-align-activia, 1 hour after each dose of antibiotic to prevent diarrhea.  If you develop any new or worsening symptoms return for reevaluation or see your primary care provider.      ED Prescriptions     Medication Sig Dispense Auth. Provider   amoxicillin-clavulanate (AUGMENTIN) 875-125 MG tablet Take 1 tablet by mouth every 12 (twelve) hours for 10 days. 20 tablet Margarette Canada, NP   fluconazole (DIFLUCAN) 200 MG tablet Take 1 tablet (200 mg total) by mouth daily for 2 doses. 2 tablet  Margarette Canada, NP      PDMP not reviewed this encounter.   Margarette Canada, NP 11/29/21 1431

## 2021-12-10 ENCOUNTER — Other Ambulatory Visit: Payer: Self-pay | Admitting: Family Medicine

## 2021-12-10 DIAGNOSIS — Z1231 Encounter for screening mammogram for malignant neoplasm of breast: Secondary | ICD-10-CM

## 2022-01-01 ENCOUNTER — Ambulatory Visit
Admission: RE | Admit: 2022-01-01 | Discharge: 2022-01-01 | Disposition: A | Discharge status: Home / Self Care | Payer: No Typology Code available for payment source | Source: Ambulatory Visit | Attending: Family Medicine | Admitting: Family Medicine

## 2022-01-01 DIAGNOSIS — Z1231 Encounter for screening mammogram for malignant neoplasm of breast: Secondary | ICD-10-CM | POA: Diagnosis present

## 2022-10-31 ENCOUNTER — Other Ambulatory Visit: Payer: Self-pay | Admitting: Oncology

## 2022-10-31 MED ORDER — AMOXICILLIN-POT CLAVULANATE 875-125 MG PO TABS: 1.0000 | ORAL_TABLET | Freq: Two times a day (BID) | ORAL | 0 refills | Status: DC

## 2023-03-10 ENCOUNTER — Ambulatory Visit: Payer: No Typology Code available for payment source

## 2023-03-10 ENCOUNTER — Ambulatory Visit
Admission: EM | Admit: 2023-03-10 | Discharge: 2023-03-10 | Disposition: A | Discharge status: Home / Self Care | Payer: No Typology Code available for payment source | Attending: Emergency Medicine | Admitting: Emergency Medicine

## 2023-03-10 ENCOUNTER — Telehealth: Payer: Self-pay | Admitting: Emergency Medicine

## 2023-03-10 DIAGNOSIS — J069 Acute upper respiratory infection, unspecified: Secondary | ICD-10-CM

## 2023-03-10 LAB — RESP PANEL BY RT-PCR (FLU A&B, COVID) ARPGX2
Influenza A by PCR: NEGATIVE
Influenza B by PCR: NEGATIVE
SARS Coronavirus 2 by RT PCR: NEGATIVE

## 2023-03-10 MED ORDER — DOXYCYCLINE HYCLATE 100 MG PO CAPS: 100.0000 mg | ORAL_CAPSULE | Freq: Two times a day (BID) | ORAL | 0 refills | Status: AC

## 2023-03-10 MED ORDER — BENZONATATE 100 MG PO CAPS: 100.0000 mg | ORAL_CAPSULE | Freq: Three times a day (TID) | ORAL | 0 refills | Status: AC | PRN

## 2023-03-10 NOTE — ED Provider Notes (Signed)
[facility]    CSN: [id number] Arrival date & time: 03/10/23  0815      History   Chief Complaint Chief Complaint  Patient presents with   Fever   Cough    HPI Angela [name] is a 42 y.o. female.   42 year old female pt, Angela [name] presents to urgent care for evaluation of fever and cough x 2 days. Pt states she was seen in [place] at urgent care on Sunday and treated for asthma flare with prednisone, pt was advised by UC to come back for CXR if no improvement, pt states she is here for chest xray was not driving back to UC for CXR. Pt endorses her 2 children have pneumonia at home,requesting antibiotic.  The history is provided by the patient. No language interpreter was used.    Past Medical History:  Diagnosis Date   Abnormal Pap smear of cervix    Anxiety    Asthma    Complication of anesthesia    blood pressure drop   Depression    Environmental and seasonal allergies    GERD (gastroesophageal reflux disease)    Herpes genitalis    Vitamin D deficiency     Patient Active Problem List   Diagnosis Date Noted   URI with cough and congestion 03/10/2023    Past Surgical History:  Procedure Laterality Date   APPENDECTOMY     BREAST BIOPSY Right 10/08/2018   fibroadenoma   BREAST BIOPSY Left 07/10/2021   [place] bx, coil marker, path pending   BREAST REDUCTION SURGERY  2000   LAPAROSCOPIC TUBAL LIGATION Bilateral 09/28/2017   Procedure: LAPAROSCOPIC BILATERAL TUBAL LIGATION;  Surgeon: Hildred Laser, MD;  Location: [facility] OR[facility];  Service: Gynecology;  Laterality: Bilateral;   MOUTH SURGERY     REDUCTION MAMMAPLASTY Bilateral 2000   skin cyst     TONSILLECTOMY      OB History     Gravida  2   Para  2   Term  2   Preterm      AB      Living  2      SAB      IAB      Ectopic      Multiple  0   Live Births  2            Home Medications    Prior to Admission medications   Medication Sig Start Date End Date  Taking? Authorizing Provider  azelastine (ASTELIN) 0.1 % nasal spray PLACE 1 SPRAY INTO BOTH NOSTRILS DAILY. 01/13/18  Yes Shambley, Melody N, CNM  benzonatate (TESSALON) 100 MG capsule Take 1 capsule (100 mg total) by mouth 3 (three) times daily as needed for up to 5 days for cough. 03/10/23 03/15/23 Yes Leaann Nevils, Para March, NP  buPROPion (WELLBUTRIN XL) 150 MG 24 hr tablet Take 150 mg by mouth daily. 01/13/23  Yes [provider]  etonogestrel-ethinyl estradiol (NUVARING) 0.12-0.015 MG/24HR vaginal ring USE AS DIRECTED 08/26/21  Yes Linzie Collin, MD  fluticasone [facility]) 50 MCG/ACT nasal spray Place 1 spray into both nostrils daily.    Yes [provider]  ketotifen (ZADITOR) 0.025 % ophthalmic solution Place 1 drop into both eyes daily as needed (allergies).   Yes [provider]  Multiple Vitamin (MULTIVITAMIN) tablet Take 1 tablet by mouth daily.   Yes [provider]  norelgestromin-ethinyl estradiol [facility]) 150-35 MCG/24HR transdermal patch Place 1 patch onto the skin once a week. 02/11/23  Yes [provider]  predniSONE (DELTASONE) 20 MG tablet Take 20 mg by mouth 2 (two) times daily. 03/08/23  Yes [provider]  TYRVAYA 0.03 MG/ACT SOLN Place into both nostrils. 11/05/21  Yes [provider]  valACYclovir (VALTREX) 500 MG tablet Take 1 tablet (500 mg total) by mouth 2 (two) times daily. 07/15/17  Yes Shambley, Melody N, CNM  albuterol (PROVENTIL HFA;VENTOLIN HFA) 108 (90 Base) MCG/ACT inhaler Inhale 2 puffs into the lungs every 4 (four) hours as needed for wheezing or shortness of breath. 11/23/16   Renford Dills, NP  doxycycline (VIBRAMYCIN) 100 MG capsule Take 1 capsule (100 mg total) by mouth 2 (two) times daily for 5 days. 03/10/23 03/15/23  Enid Maultsby, Para March, NP  ibuprofen (ADVIL,MOTRIN) 600 MG tablet Take 1 tablet (600 mg total) by mouth every 6 (six) hours. 08/04/17   Doreene Burke, CNM    Family History Family  History  Adopted: Yes  Family history unknown: Yes    Social History Social History   Tobacco Use   Smoking status: Never   Smokeless tobacco: Never  Vaping Use   Vaping status: Never Used  Substance Use Topics   Alcohol use: Yes    Comment: occas   Drug use: No     Allergies   Peanut-containing drug products   Review of Systems Review of Systems  Constitutional:  Positive for fever.  Respiratory:  Positive for cough. Negative for shortness of breath, wheezing and stridor.   Cardiovascular:  Negative for chest pain and palpitations.  All other systems reviewed and are negative.    Physical Exam Triage Vital Signs ED Triage Vitals  Encounter Vitals Group     BP      Systolic BP Percentile      Diastolic BP Percentile      Pulse      Resp      Temp      Temp src      SpO2      Weight      Height      Head Circumference      Peak Flow      Pain Score      Pain Loc      Pain Education      Exclude from Growth Chart    No data found.  Updated Vital Signs BP 120/79 (BP Location: Left Arm)   Pulse (!) 106   Temp 99.8 F (37.7 C) (Oral)   Resp 16   Ht 5\' 8"  (1.727 m)   Wt 185 lb (83.9 kg)   LMP 02/20/2023 (Approximate)   SpO2 99%   BMI 28.13 kg/m   Visual Acuity Right Eye Distance:   Left Eye Distance:   Bilateral Distance:    Right Eye Near:   Left Eye Near:    Bilateral Near:     Physical Exam Vitals and nursing note reviewed.  Constitutional:      Appearance: She is well-developed and well-groomed.  Cardiovascular:     Rate and Rhythm: Regular rhythm. Tachycardia present.     Pulses: Normal pulses.     Heart sounds: Normal heart sounds.  Pulmonary:     Effort: Pulmonary effort is normal.     Breath sounds: Normal breath sounds and air entry.     Comments: Sat 99% on RA Neurological:     General: No focal deficit present.     Mental Status: She is alert and oriented to person, place, and time.  GCS: GCS eye subscore is 4. GCS  verbal subscore is 5. GCS motor subscore is 6.     Cranial Nerves: No cranial nerve deficit.     Sensory: No sensory deficit.  Psychiatric:        Attention and Perception: Attention normal.        Mood and Affect: Mood normal.        Speech: Speech normal.        Behavior: Behavior normal. Behavior is cooperative.      UC Treatments / Results  Labs (all labs ordered are listed, but only abnormal results are displayed) Labs Reviewed  RESP PANEL BY RT-PCR (FLU A&B, COVID) [id number]    EKG   Radiology DG Chest 2 View  Result Date: 03/10/2023 CLINICAL DATA:  cough,fever x 2 days EXAM: CHEST - 2 VIEW COMPARISON:  None Available. FINDINGS: The cardiomediastinal silhouette is within normal limits. No pleural effusion. No pneumothorax. Increased consolidation of the left lower lobe. No acute osseous abnormality. IMPRESSION: Left lower lobe pneumonia. Follow-up radiograph in 6-8 weeks is recommended to ensure resolution. Electronically Signed   By: Olive Bass M.D.   On: 03/10/2023 13:10    Procedures Procedures (including critical care time)  Medications Ordered in UC Medications - No data to display  Initial Impression / Assessment and Plan / UC Course  I have reviewed the triage vital signs and the nursing notes.  Pertinent labs & imaging results that were available during my care of the patient were reviewed by me and considered in my medical decision making (see chart for details).  Clinical Course as of 03/10/23 1442  Tue Mar 10, 2023  0906 Cxr for cough fever x 2 days, children have pneumonia, pt sent from other [facility] for CXR [JD]  0925 Flu and covid are negative. [JD]  1437 LLL pneumonia per radiologist, will script antibiotic,doxycycline 500mg  po bid x 5 days [JD]    Clinical Course User Index [JD] Joson Sapp, Para March, NP   Discussed exam findings and plan of care with patient, strict go to ER precautions given.   Patient verbalized understanding to this  provider.  Ddx: URI, LLL pneumonia,viral illness Final Clinical Impressions(s) / UC Diagnoses   Final diagnoses:  URI with cough and congestion     Discharge Instructions      Your xray does not appear to show pneumonia, if radiologist reads as such we will call you in an antibiotic. Please check my chart for results.   Your covid and flu are both negative today in office. Most likely you have a viral illness.   Rest,push fluids, take tessalon as prescribed. Follow up with PCP if symptoms worsen. Continue prednisone.      ED Prescriptions     Medication Sig Dispense Auth. Provider   benzonatate (TESSALON) 100 MG capsule Take 1 capsule (100 mg total) by mouth 3 (three) times daily as needed for up to 5 days for cough. 15 capsule Dsean Vantol, Para March, NP      PDMP not reviewed this encounter.   Clancy Gourd, NP 03/10/23 1442

## 2023-03-10 NOTE — ED Triage Notes (Signed)
Pt c/o fever & cough x2 days. Tmax 102.4 around midnight. Has tried IBU w/o relief. Was seen at [facility] on Sunday, dx w/asthma flare up & was given prednisone.

## 2023-03-10 NOTE — Discharge Instructions (Addendum)
Your xray does not appear to show pneumonia, if radiologist reads as such we will call you in an antibiotic. Please check my chart for results.   Your covid and flu are both negative today in office. Most likely you have a viral illness.   Rest,push fluids, take tessalon as prescribed. Follow up with PCP if symptoms worsen. Continue prednisone.

## 2023-03-10 NOTE — Telephone Encounter (Signed)
LLL pneumonia per radiology, treating with doxycycline 100 mg po2 times daily x 5 days

## 2023-03-17 ENCOUNTER — Other Ambulatory Visit: Payer: Self-pay | Admitting: Family Medicine

## 2023-03-17 DIAGNOSIS — Z1231 Encounter for screening mammogram for malignant neoplasm of breast: Secondary | ICD-10-CM

## 2023-04-13 ENCOUNTER — Ambulatory Visit
Admission: RE | Admit: 2023-04-13 | Discharge: 2023-04-13 | Disposition: A | Discharge status: Home / Self Care | Payer: No Typology Code available for payment source | Attending: Family Medicine | Admitting: Family Medicine

## 2023-04-13 ENCOUNTER — Other Ambulatory Visit: Payer: Self-pay | Admitting: Family Medicine

## 2023-04-13 ENCOUNTER — Ambulatory Visit
Admission: RE | Admit: 2023-04-13 | Discharge: 2023-04-13 | Disposition: A | Discharge status: Home / Self Care | Payer: No Typology Code available for payment source | Source: Ambulatory Visit | Attending: Family Medicine | Admitting: Family Medicine

## 2023-04-13 DIAGNOSIS — J189 Pneumonia, unspecified organism: Secondary | ICD-10-CM

## 2023-04-29 ENCOUNTER — Ambulatory Visit
Admission: RE | Admit: 2023-04-29 | Discharge: 2023-04-29 | Disposition: A | Discharge status: Home / Self Care | Payer: BC Managed Care – PPO | Source: Ambulatory Visit | Attending: Family Medicine | Admitting: Family Medicine

## 2023-04-29 DIAGNOSIS — Z1231 Encounter for screening mammogram for malignant neoplasm of breast: Secondary | ICD-10-CM | POA: Insufficient documentation

## 2023-12-23 ENCOUNTER — Other Ambulatory Visit: Payer: Self-pay | Admitting: Obstetrics and Gynecology

## 2023-12-23 DIAGNOSIS — M79621 Pain in right upper arm: Secondary | ICD-10-CM

## 2023-12-23 DIAGNOSIS — M79622 Pain in left upper arm: Secondary | ICD-10-CM

## 2023-12-23 DIAGNOSIS — M7989 Other specified soft tissue disorders: Secondary | ICD-10-CM

## 2023-12-25 ENCOUNTER — Ambulatory Visit
Admission: RE | Admit: 2023-12-25 | Discharge: 2023-12-25 | Disposition: A | Discharge status: Home / Self Care | Source: Ambulatory Visit | Attending: Obstetrics and Gynecology | Admitting: Obstetrics and Gynecology

## 2023-12-25 ENCOUNTER — Other Ambulatory Visit: Payer: Self-pay | Admitting: Obstetrics and Gynecology

## 2023-12-25 DIAGNOSIS — M7989 Other specified soft tissue disorders: Secondary | ICD-10-CM | POA: Insufficient documentation

## 2023-12-25 DIAGNOSIS — M79621 Pain in right upper arm: Secondary | ICD-10-CM

## 2023-12-25 DIAGNOSIS — M79622 Pain in left upper arm: Secondary | ICD-10-CM | POA: Diagnosis present

## 2023-12-30 ENCOUNTER — Other Ambulatory Visit: Payer: Self-pay | Admitting: Obstetrics and Gynecology

## 2023-12-30 DIAGNOSIS — R928 Other abnormal and inconclusive findings on diagnostic imaging of breast: Secondary | ICD-10-CM

## 2024-01-07 ENCOUNTER — Ambulatory Visit
Admission: RE | Admit: 2024-01-07 | Discharge: 2024-01-07 | Disposition: A | Discharge status: Home / Self Care | Source: Ambulatory Visit | Attending: Obstetrics and Gynecology | Admitting: Obstetrics and Gynecology

## 2024-01-07 DIAGNOSIS — N649 Disorder of breast, unspecified: Secondary | ICD-10-CM | POA: Insufficient documentation

## 2024-01-07 DIAGNOSIS — R928 Other abnormal and inconclusive findings on diagnostic imaging of breast: Secondary | ICD-10-CM | POA: Insufficient documentation

## 2024-01-07 MED ORDER — LIDOCAINE-EPINEPHRINE 1 %-1:100000 IJ SOLN
8.0000 mL | Freq: Once | INTRAMUSCULAR | Status: AC
Start: 2024-01-07 — End: 2024-01-07
  Administered 2024-01-07: 8 mL
  Filled 2024-01-07: qty 8

## 2024-01-07 MED ORDER — LIDOCAINE 1 % OPTIME INJ - NO CHARGE
1.0000 mL | Freq: Once | INTRAMUSCULAR | Status: AC
  Administered 2024-01-07: 1 mL
  Filled 2024-01-07: qty 2

## 2024-01-08 LAB — SURGICAL PATHOLOGY

## 2024-01-11 ENCOUNTER — Encounter: Payer: Self-pay | Admitting: *Deleted

## 2024-01-11 NOTE — Progress Notes (Signed)
 Referral recieved from [facility] for benign breast mass. VM left asking for return call to discuss surgeons.

## 2024-01-13 ENCOUNTER — Encounter: Payer: Self-pay | Admitting: *Deleted

## 2024-01-13 NOTE — Progress Notes (Signed)
 Attempted to reach Angela [name] to discuss surgical consultation.  Voice mail left asking for return call.

## 2024-01-20 ENCOUNTER — Encounter: Payer: Self-pay | Admitting: *Deleted

## 2024-01-20 NOTE — Progress Notes (Signed)
 Spoke with Angela [name], she has an appointment scheduled to meet with a surgeon next week.  No futher needs.
